# Patient Record
Sex: Female | Born: 1935 | Race: White | Hispanic: No | Marital: Single | Smoking: Current some day smoker
Health system: Southern US, Community
[De-identification: ages and names within clinical notes are randomized; demographics above are authoritative.]

## PROBLEM LIST (undated history)

## (undated) DIAGNOSIS — Z720 Tobacco use: Secondary | ICD-10-CM

## (undated) DIAGNOSIS — Z923 Personal history of irradiation: Secondary | ICD-10-CM

## (undated) DIAGNOSIS — R4701 Aphasia: Secondary | ICD-10-CM

## (undated) DIAGNOSIS — C801 Malignant (primary) neoplasm, unspecified: Secondary | ICD-10-CM

## (undated) HISTORY — PX: BREAST LUMPECTOMY: SHX2

## (undated) HISTORY — PX: FEMUR FRACTURE SURGERY: SHX633

## (undated) HISTORY — PX: BREAST SURGERY: SHX581

---

## 1998-05-17 ENCOUNTER — Emergency Department (HOSPITAL_COMMUNITY): Admission: EM | Admit: 1998-05-17 | Discharge: 1998-05-17 | Payer: Self-pay | Admitting: Emergency Medicine

## 1998-06-28 ENCOUNTER — Other Ambulatory Visit: Admission: RE | Admit: 1998-06-28 | Discharge: 1998-06-28 | Payer: Self-pay | Admitting: Obstetrics and Gynecology

## 1999-01-04 ENCOUNTER — Encounter: Admission: RE | Admit: 1999-01-04 | Discharge: 1999-01-04 | Payer: Self-pay | Admitting: *Deleted

## 1999-01-04 ENCOUNTER — Encounter: Payer: Self-pay | Admitting: *Deleted

## 1999-12-09 ENCOUNTER — Other Ambulatory Visit: Admission: RE | Admit: 1999-12-09 | Discharge: 1999-12-09 | Payer: Self-pay | Admitting: Obstetrics and Gynecology

## 2000-01-14 ENCOUNTER — Emergency Department (HOSPITAL_COMMUNITY): Admission: EM | Admit: 2000-01-14 | Discharge: 2000-01-14 | Payer: Self-pay | Admitting: Emergency Medicine

## 2000-01-14 ENCOUNTER — Encounter: Payer: Self-pay | Admitting: Emergency Medicine

## 2000-12-24 ENCOUNTER — Other Ambulatory Visit: Admission: RE | Admit: 2000-12-24 | Discharge: 2000-12-24 | Payer: Self-pay | Admitting: Obstetrics and Gynecology

## 2001-12-26 ENCOUNTER — Other Ambulatory Visit: Admission: RE | Admit: 2001-12-26 | Discharge: 2001-12-26 | Payer: Self-pay | Admitting: Obstetrics and Gynecology

## 2003-03-16 ENCOUNTER — Encounter (INDEPENDENT_AMBULATORY_CARE_PROVIDER_SITE_OTHER): Payer: Self-pay | Admitting: Specialist

## 2003-03-16 ENCOUNTER — Encounter: Admission: RE | Admit: 2003-03-16 | Discharge: 2003-03-16 | Payer: Self-pay | Admitting: Surgery

## 2003-03-24 ENCOUNTER — Encounter (HOSPITAL_COMMUNITY): Admission: RE | Admit: 2003-03-24 | Discharge: 2003-06-22 | Payer: Self-pay | Admitting: Surgery

## 2003-03-26 ENCOUNTER — Encounter: Admission: RE | Admit: 2003-03-26 | Discharge: 2003-03-26 | Payer: Self-pay | Admitting: Surgery

## 2003-03-26 DIAGNOSIS — Z853 Personal history of malignant neoplasm of breast: Secondary | ICD-10-CM | POA: Insufficient documentation

## 2003-03-30 ENCOUNTER — Encounter (INDEPENDENT_AMBULATORY_CARE_PROVIDER_SITE_OTHER): Payer: Self-pay | Admitting: *Deleted

## 2003-03-30 ENCOUNTER — Ambulatory Visit (HOSPITAL_COMMUNITY): Admission: RE | Admit: 2003-03-30 | Discharge: 2003-03-30 | Payer: Self-pay | Admitting: Surgery

## 2003-03-30 ENCOUNTER — Ambulatory Visit (HOSPITAL_BASED_OUTPATIENT_CLINIC_OR_DEPARTMENT_OTHER): Admission: RE | Admit: 2003-03-30 | Discharge: 2003-03-30 | Payer: Self-pay | Admitting: Surgery

## 2003-03-30 ENCOUNTER — Encounter (INDEPENDENT_AMBULATORY_CARE_PROVIDER_SITE_OTHER): Payer: Self-pay | Admitting: Surgery

## 2003-04-22 ENCOUNTER — Encounter: Payer: Self-pay | Admitting: Cardiology

## 2003-04-22 ENCOUNTER — Ambulatory Visit: Admission: RE | Admit: 2003-04-22 | Discharge: 2003-04-22 | Payer: Self-pay | Admitting: Oncology

## 2003-04-22 ENCOUNTER — Ambulatory Visit: Admission: RE | Admit: 2003-04-22 | Discharge: 2003-05-26 | Payer: Self-pay | Admitting: Radiation Oncology

## 2003-04-23 ENCOUNTER — Ambulatory Visit (HOSPITAL_COMMUNITY): Admission: RE | Admit: 2003-04-23 | Discharge: 2003-04-23 | Payer: Self-pay | Admitting: Oncology

## 2003-05-06 ENCOUNTER — Ambulatory Visit (HOSPITAL_COMMUNITY): Admission: RE | Admit: 2003-05-06 | Discharge: 2003-05-06 | Payer: Self-pay | Admitting: Surgery

## 2003-05-06 ENCOUNTER — Ambulatory Visit (HOSPITAL_BASED_OUTPATIENT_CLINIC_OR_DEPARTMENT_OTHER): Admission: RE | Admit: 2003-05-06 | Discharge: 2003-05-06 | Payer: Self-pay | Admitting: Surgery

## 2003-05-06 ENCOUNTER — Encounter (INDEPENDENT_AMBULATORY_CARE_PROVIDER_SITE_OTHER): Payer: Self-pay | Admitting: Specialist

## 2003-05-25 ENCOUNTER — Ambulatory Visit (HOSPITAL_COMMUNITY): Admission: RE | Admit: 2003-05-25 | Discharge: 2003-05-25 | Payer: Self-pay | Admitting: Oncology

## 2003-06-05 ENCOUNTER — Ambulatory Visit (HOSPITAL_COMMUNITY): Admission: RE | Admit: 2003-06-05 | Discharge: 2003-06-05 | Payer: Self-pay | Admitting: Oncology

## 2003-06-12 ENCOUNTER — Ambulatory Visit (HOSPITAL_BASED_OUTPATIENT_CLINIC_OR_DEPARTMENT_OTHER): Admission: RE | Admit: 2003-06-12 | Discharge: 2003-06-12 | Payer: Self-pay | Admitting: Surgery

## 2003-07-28 ENCOUNTER — Emergency Department (HOSPITAL_COMMUNITY): Admission: EM | Admit: 2003-07-28 | Discharge: 2003-07-29 | Payer: Self-pay | Admitting: Emergency Medicine

## 2003-08-05 ENCOUNTER — Ambulatory Visit: Admission: RE | Admit: 2003-08-05 | Discharge: 2003-08-05 | Payer: Self-pay | Admitting: Geriatric Medicine

## 2003-09-03 ENCOUNTER — Ambulatory Visit (HOSPITAL_COMMUNITY): Admission: RE | Admit: 2003-09-03 | Discharge: 2003-09-03 | Payer: Self-pay | Admitting: Oncology

## 2003-11-10 ENCOUNTER — Ambulatory Visit: Admission: RE | Admit: 2003-11-10 | Discharge: 2004-01-21 | Payer: Self-pay | Admitting: Radiation Oncology

## 2003-11-17 ENCOUNTER — Encounter: Admission: RE | Admit: 2003-11-17 | Discharge: 2003-11-17 | Payer: Self-pay | Admitting: Radiation Oncology

## 2003-12-18 ENCOUNTER — Ambulatory Visit: Payer: Self-pay | Admitting: Oncology

## 2004-02-18 ENCOUNTER — Ambulatory Visit: Admission: RE | Admit: 2004-02-18 | Discharge: 2004-02-18 | Payer: Self-pay | Admitting: Radiation Oncology

## 2004-03-03 ENCOUNTER — Ambulatory Visit: Payer: Self-pay | Admitting: Oncology

## 2004-03-28 ENCOUNTER — Encounter: Admission: RE | Admit: 2004-03-28 | Discharge: 2004-03-28 | Payer: Self-pay | Admitting: Oncology

## 2004-05-04 ENCOUNTER — Ambulatory Visit (HOSPITAL_BASED_OUTPATIENT_CLINIC_OR_DEPARTMENT_OTHER): Admission: RE | Admit: 2004-05-04 | Discharge: 2004-05-04 | Payer: Self-pay | Admitting: Surgery

## 2004-05-24 ENCOUNTER — Ambulatory Visit: Payer: Self-pay | Admitting: Oncology

## 2004-07-13 ENCOUNTER — Ambulatory Visit: Payer: Self-pay | Admitting: Oncology

## 2004-07-31 IMAGING — CT CT HEAD WO/W CM
2 of 3 series · 16 of 30 positions shown, 18 images · IV contrast (omnipaque)
Comparison: none

CLINICAL DATA: Breast cancer ? new diagnosis.  Status post right lumpectomy.
TECHNIQUE: This study was done utilizing 150 cc of Omnipaque 300.  The brain was scanned before and after contrast.  Other areas scanned only after contrast.
 CT HEAD WITHOUT AND WITH CONTRAST 
 Ventricular size and CSF spaces normal for age.  No acute or focal abnormality.  Specifically no evidence for metastases.  Only normal vascular structures appear to enhance.  Calvarium intact with no definite lesions.  No fluid in the sinuses visualized. 
 IMPRESSION
 Normal.
 CT CHEST WITH CONTRAST 
 No pulmonary metastases are evident.  There are some scar-like changes at the right base.  No mediastinal adenopathy.  No pleural or pericardial fluid.  In the right axilla, there is a 2.5 cm fluid collection with internal content measuring at or near water.  This is likely a postoperative seroma.  There are some small nodes in the left axilla that are under 1 cm.  There are no right axillary nodes of significance.  
 1.  There is a fluid collection in the right axilla which is likely to be a postoperative seroma.
 2.  No metastatic disease. 
 CT ABDOMEN WITH CONTRAST 
 No liver or splenic metastases.  The right lobe of the liver extends down below the iliac crest.  Probable Gireesh?Gustave lobe configuration.  Doubt pathological.  Prior cholecystectomy.  Bile ducts normal.  
 Pancreas, adrenals, and kidneys normal.  No adenopathy or ascites. 
 1.  No evidence for metastatic disease. 
 2.  Probable Gireesh?Gustave lobe configuration to the right lobe of the liver. 
 CT PELVIS WITH CONTRAST 
 No focal masses, adenopathy, or fluid collections.  There are several calcifications within the uterus suggesting fibroid disease.  One of these is a pedunculated lesion emanating off the lower uterine segment that is positioned to the left of the rectum. 
 Unremarkable except for probable uterine fibroid disease.

[Series 3: — · axial · 0.43mm/px · z∈[-110,-0]mm · 8 of 30 slices shown, 10 images (1 of 2)]
[im 4/30  brain]
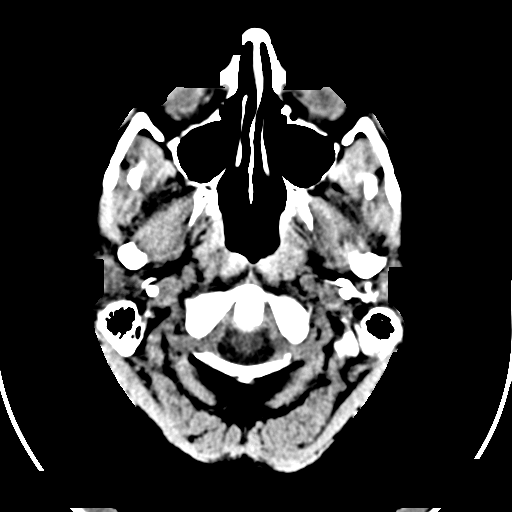
[im 4/30  bone]
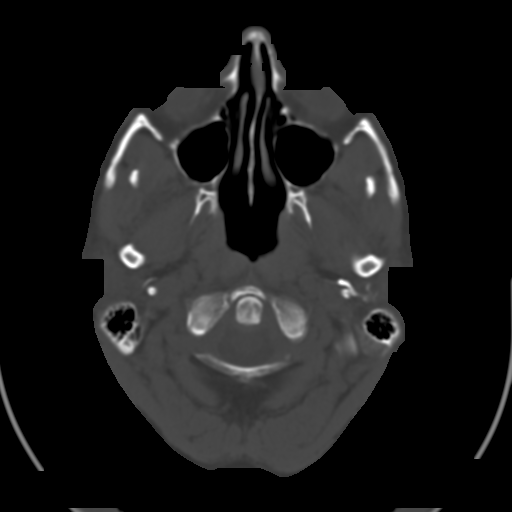
[im 7/30  brain]
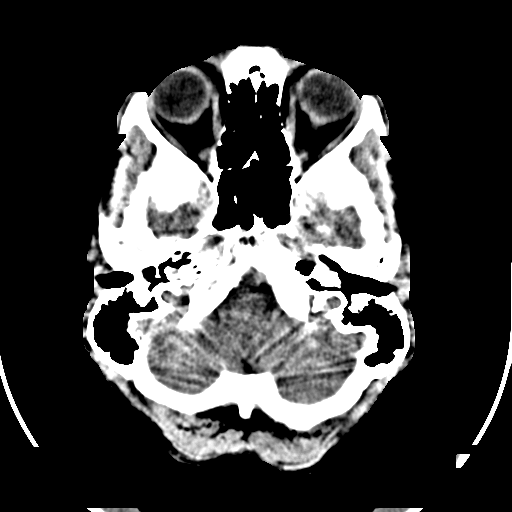
[im 10/30  brain]
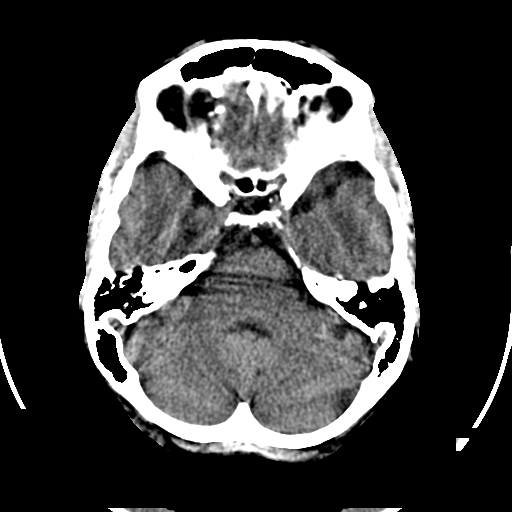
[im 13/30  brain]
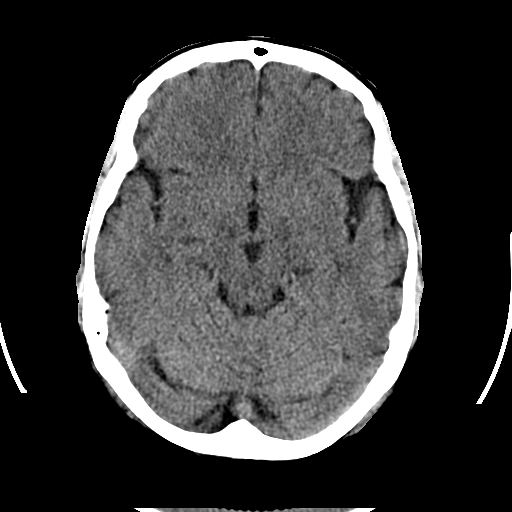
[im 17/30  brain]
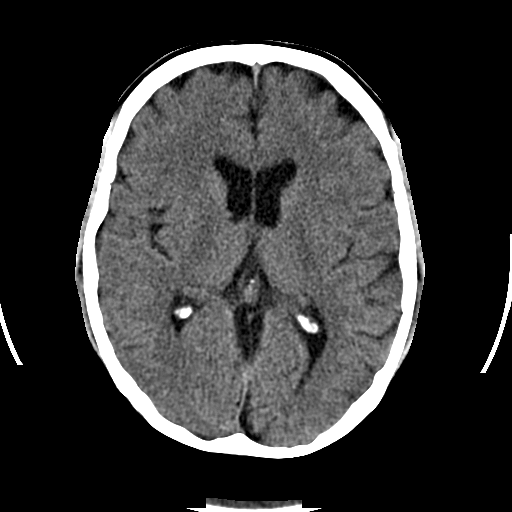
[im 17/30  bone]
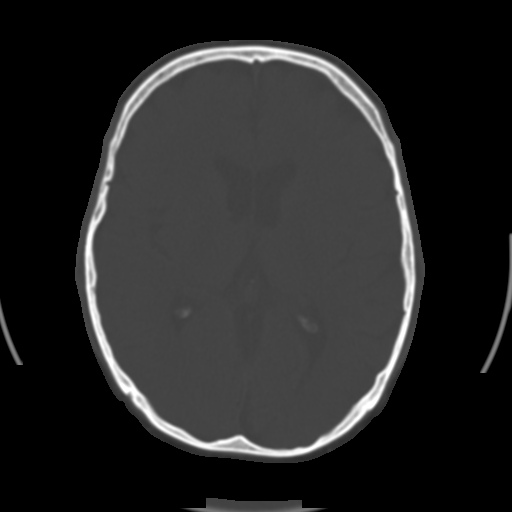
[im 20/30  brain]
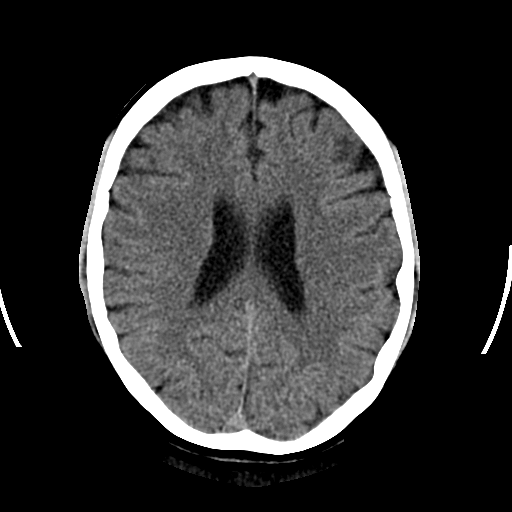
[im 23/30  brain]
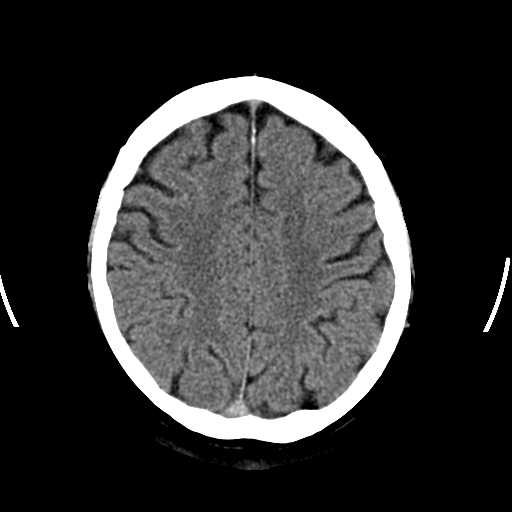
[im 26/30  brain]
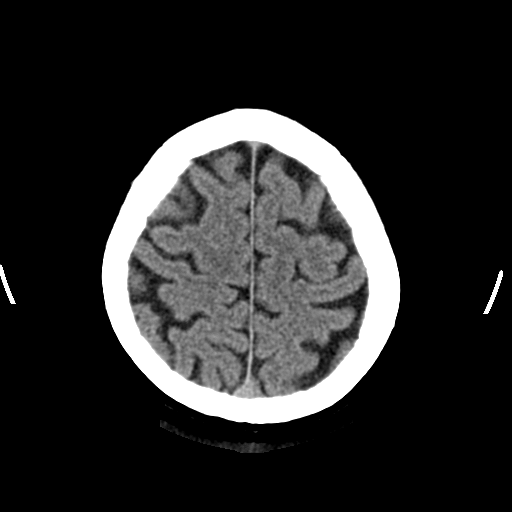

[Series 4: — · axial · 0.43mm/px · z∈[-110,-0]mm · 8 of 30 slices shown (2 of 2)]
[im 4/30  brain]
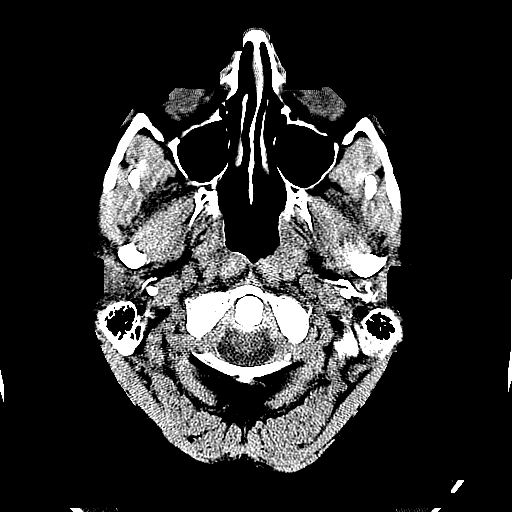
[im 7/30  brain]
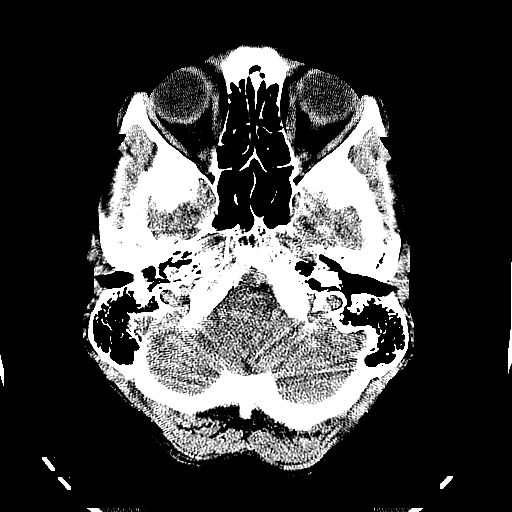
[im 10/30  brain]
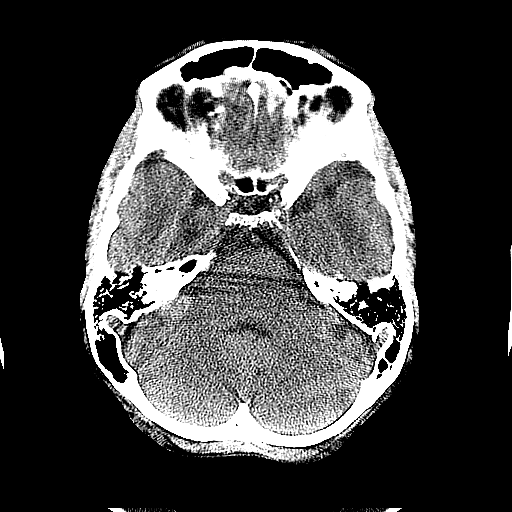
[im 13/30  brain]
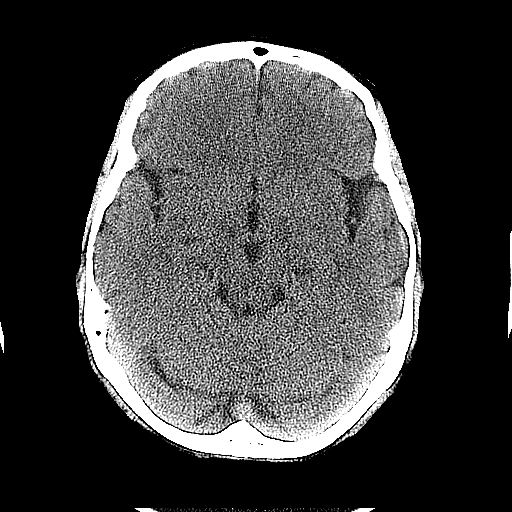
[im 17/30  brain]
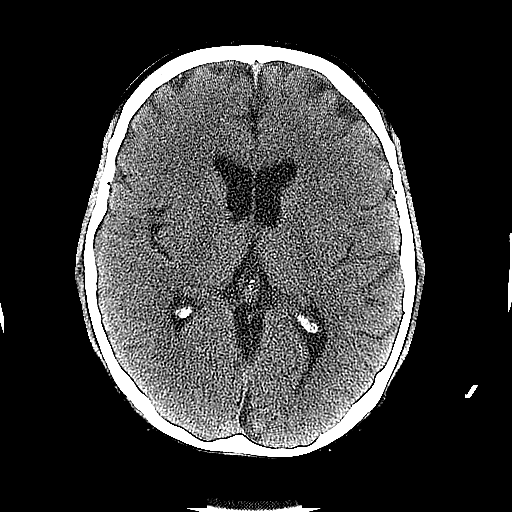
[im 20/30  brain]
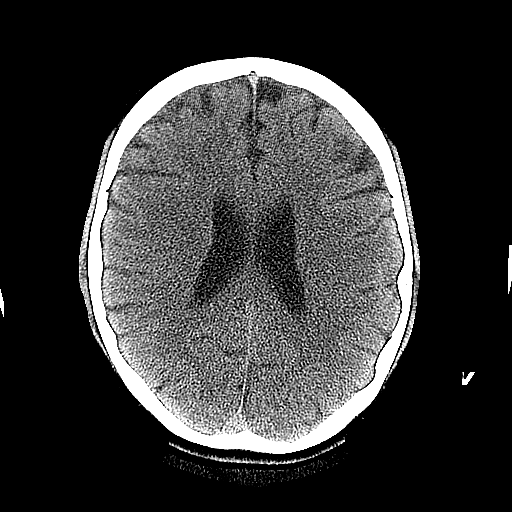
[im 23/30  brain]
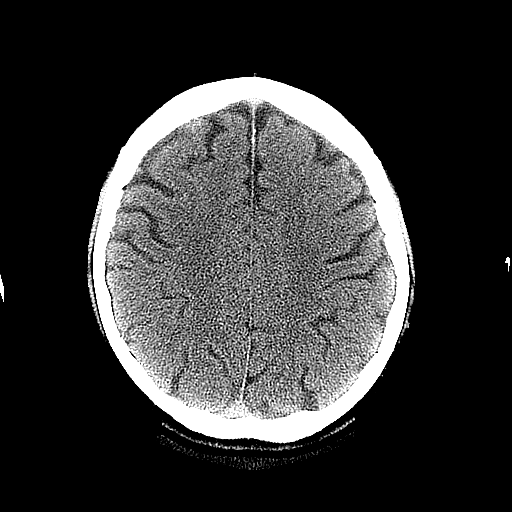
[im 26/30  brain]
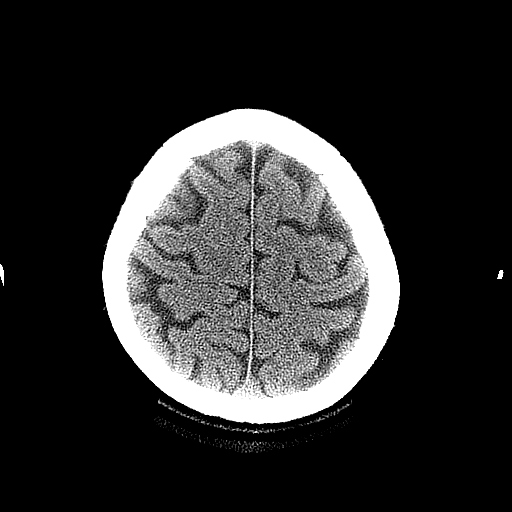

[16 of 30 positions shown; findings below may reference images not displayed]

## 2004-07-31 IMAGING — CR DG THORACIC SPINE 2V
4 series · 4 of 4 positions shown · non-contrast
Comparison: none

CLINICAL DATA: breast cancer
 TOTAL BODY BONE SCAN
 [DATE] mCi of McTTm MDP was administered intravenously with delayed images of the entire body obtained.  
 Minimal increased uptake midthoracic spine.  On the accompanying plain films this corresponds to mild degenerative changes without bony destructive lesion.  No other areas of abnormal radiotracer uptake to suggest bony metastatic disease.  Both kidneys are visualized.  Increased uptake around the right knee and left foot suggestive of degenerative changes. 
 IMPRESSION
 No evidence of abnormal radiotracer uptake to suggest bony metastatic disease as discussed above.  
 AP AND LATERAL CHEST
 Degenerative changes thoracic spine without bony destructive lesion.

[view not recorded (1 of 4)]
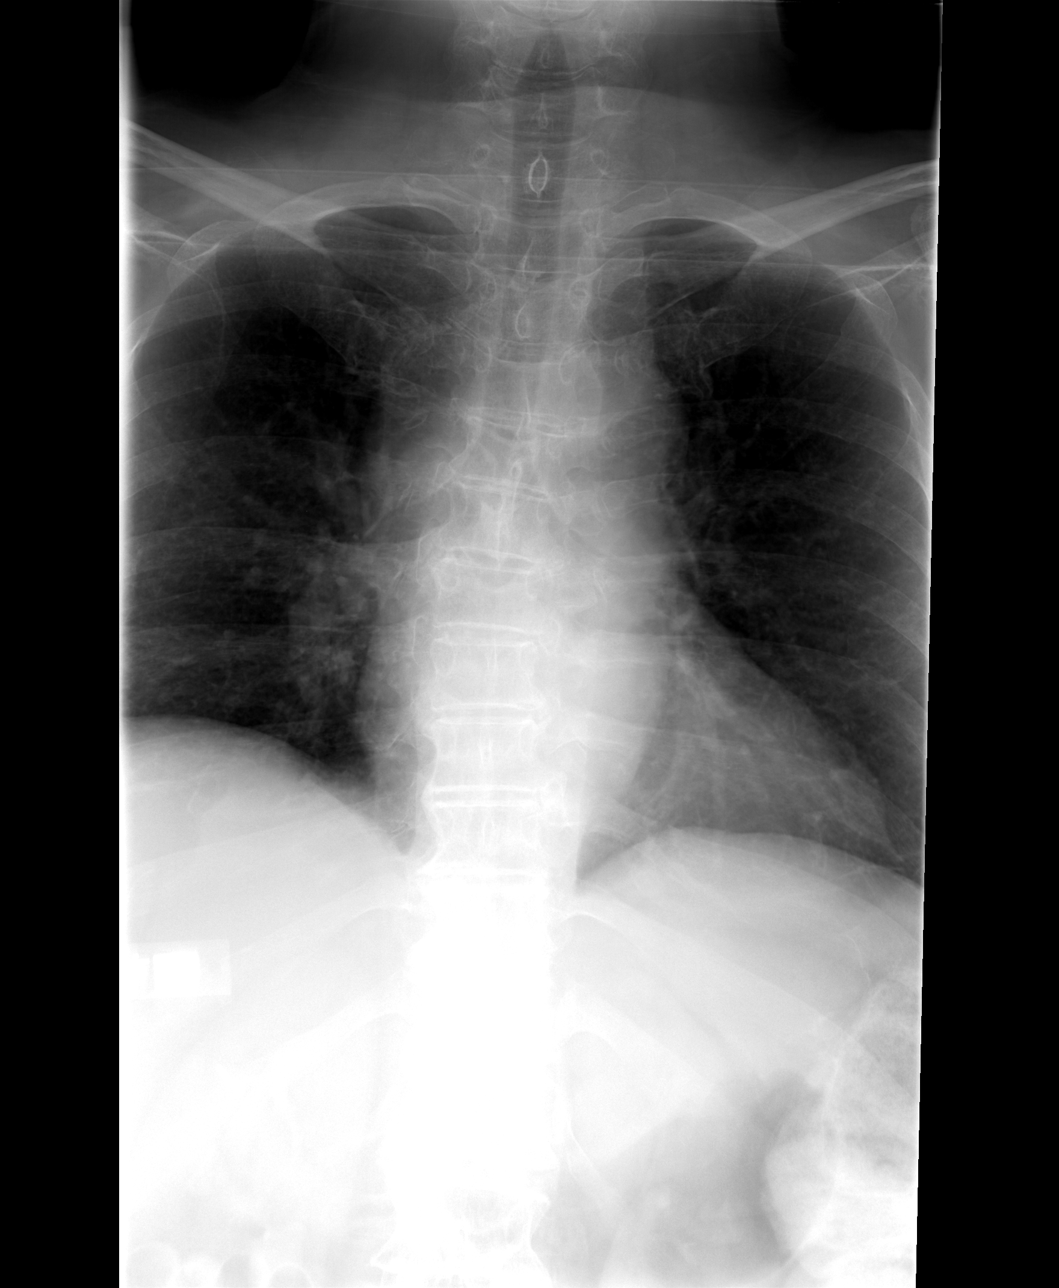

[view not recorded (2 of 4)]
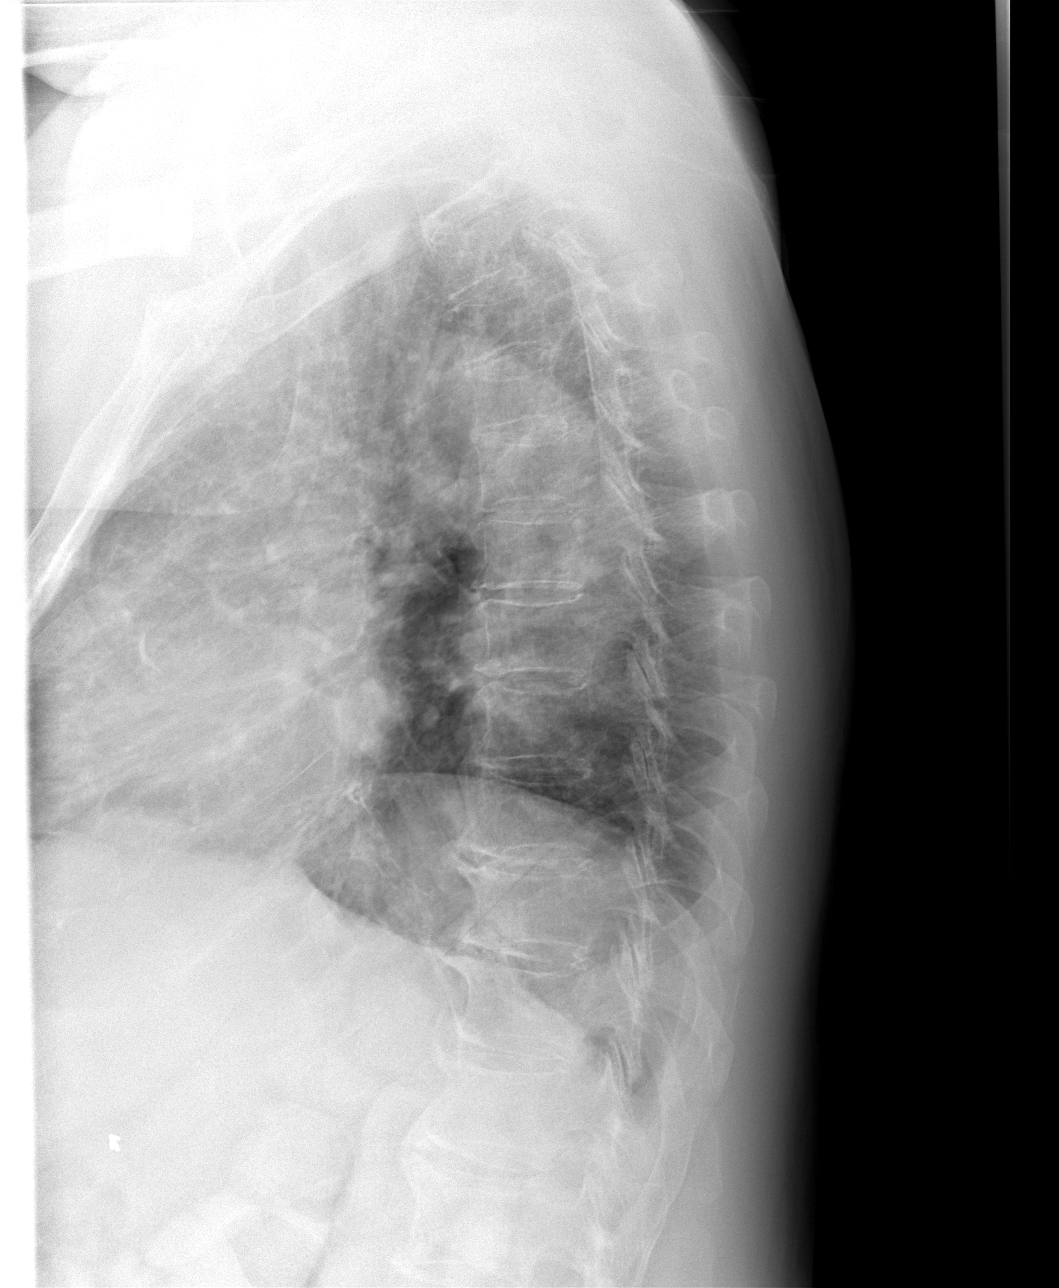

[view not recorded (3 of 4)]
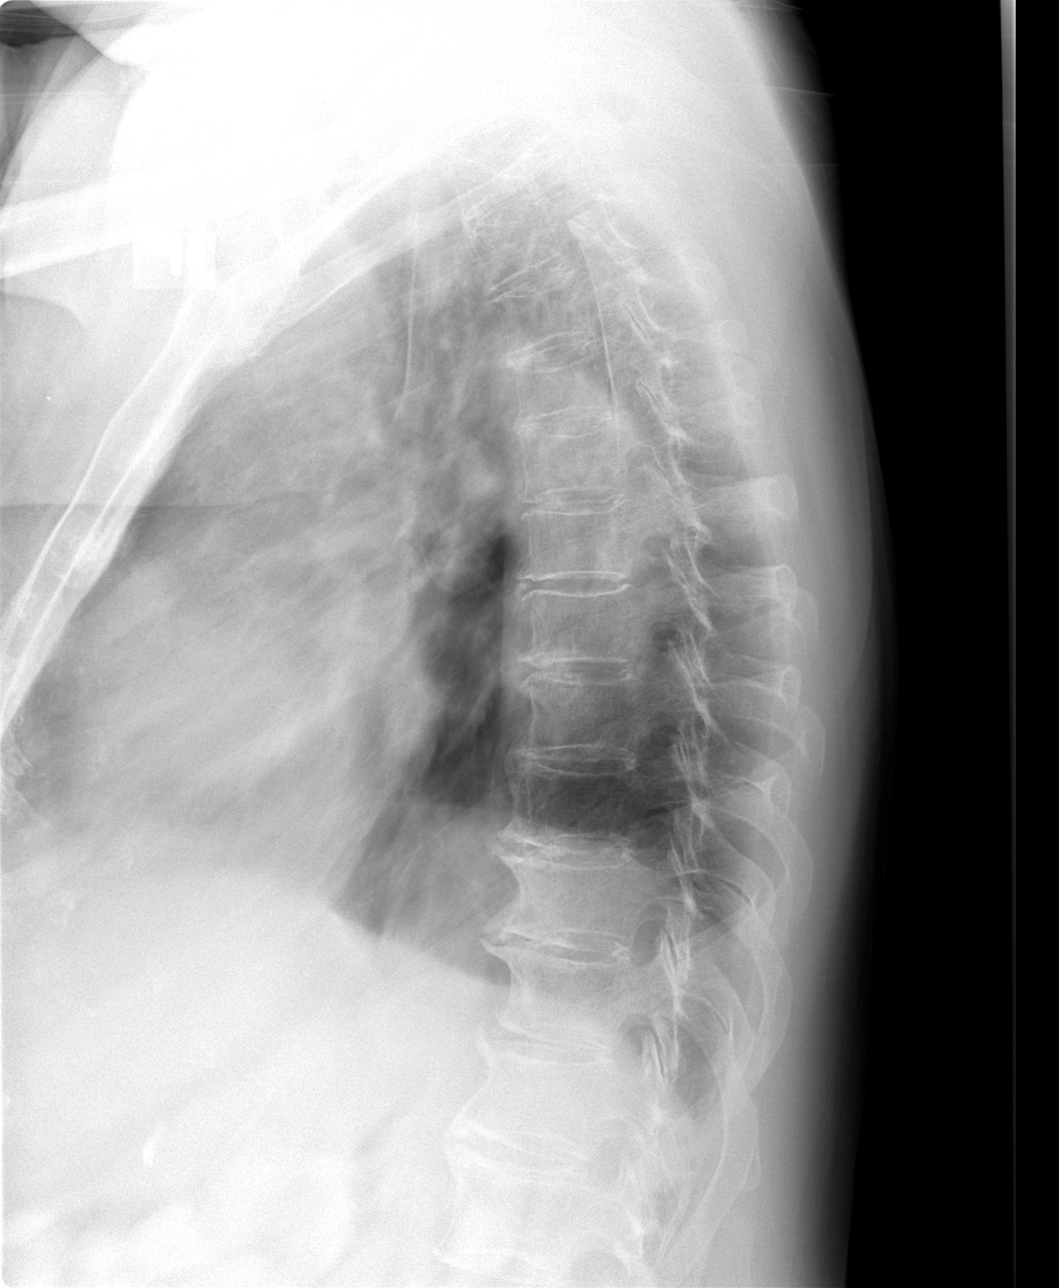

[view not recorded (4 of 4)]
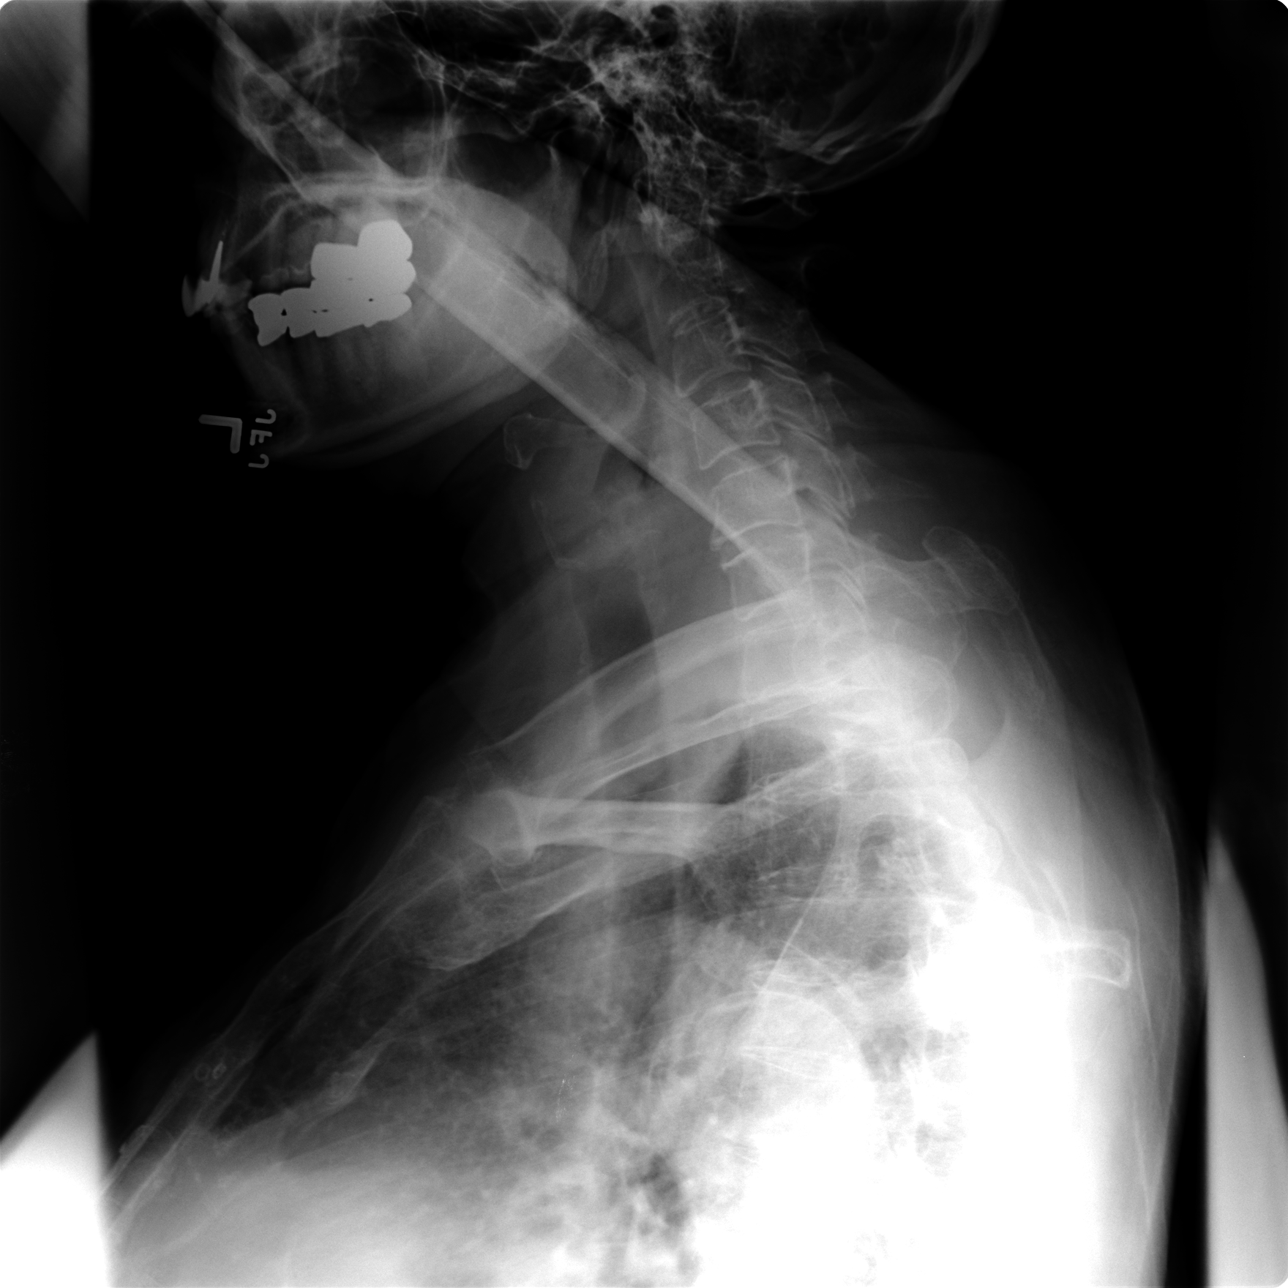

[4 of 4 positions shown; findings below may reference images not displayed]

## 2004-10-07 ENCOUNTER — Ambulatory Visit: Payer: Self-pay | Admitting: Oncology

## 2004-10-11 ENCOUNTER — Ambulatory Visit (HOSPITAL_COMMUNITY): Admission: RE | Admit: 2004-10-11 | Discharge: 2004-10-11 | Payer: Self-pay | Admitting: Oncology

## 2005-01-16 ENCOUNTER — Ambulatory Visit: Payer: Self-pay | Admitting: Oncology

## 2005-03-27 ENCOUNTER — Ambulatory Visit: Payer: Self-pay | Admitting: Oncology

## 2005-07-14 ENCOUNTER — Encounter: Admission: RE | Admit: 2005-07-14 | Discharge: 2005-07-14 | Payer: Self-pay | Admitting: Oncology

## 2005-07-17 ENCOUNTER — Ambulatory Visit: Payer: Self-pay | Admitting: Oncology

## 2005-07-17 LAB — COMPREHENSIVE METABOLIC PANEL
AST: 40 U/L — ABNORMAL HIGH (ref 0–37)
Albumin: 4 g/dL (ref 3.5–5.2)
Alkaline Phosphatase: 88 U/L (ref 39–117)
BUN: 16 mg/dL (ref 6–23)
Glucose, Bld: 306 mg/dL — ABNORMAL HIGH (ref 70–99)
Potassium: 3.9 mEq/L (ref 3.5–5.3)
Total Bilirubin: 0.8 mg/dL (ref 0.3–1.2)

## 2005-07-17 LAB — CBC WITH DIFFERENTIAL/PLATELET
Basophils Absolute: 0 10*3/uL (ref 0.0–0.1)
EOS%: 4 % (ref 0.0–7.0)
HGB: 14.2 g/dL (ref 11.6–15.9)
LYMPH%: 21.6 % (ref 14.0–48.0)
MCH: 32.3 pg (ref 26.0–34.0)
MCV: 93 fL (ref 81.0–101.0)
MONO%: 9.2 % (ref 0.0–13.0)
Platelets: 182 10*3/uL (ref 145–400)
RBC: 4.39 10*6/uL (ref 3.70–5.32)
RDW: 13.4 % (ref 11.3–14.5)

## 2005-07-21 ENCOUNTER — Ambulatory Visit (HOSPITAL_COMMUNITY): Admission: RE | Admit: 2005-07-21 | Discharge: 2005-07-21 | Payer: Self-pay | Admitting: Oncology

## 2006-01-09 ENCOUNTER — Other Ambulatory Visit: Admission: RE | Admit: 2006-01-09 | Discharge: 2006-01-09 | Payer: Self-pay | Admitting: Obstetrics and Gynecology

## 2006-01-11 ENCOUNTER — Ambulatory Visit: Payer: Self-pay | Admitting: Oncology

## 2006-01-15 ENCOUNTER — Encounter: Admission: RE | Admit: 2006-01-15 | Discharge: 2006-01-15 | Payer: Self-pay | Admitting: Obstetrics and Gynecology

## 2006-01-15 LAB — CBC WITH DIFFERENTIAL/PLATELET
BASO%: 0.5 % (ref 0.0–2.0)
Basophils Absolute: 0 10*3/uL (ref 0.0–0.1)
Eosinophils Absolute: 0.2 10*3/uL (ref 0.0–0.5)
HCT: 39.2 % (ref 34.8–46.6)
HGB: 13.6 g/dL (ref 11.6–15.9)
Platelets: 200 10*3/uL (ref 145–400)
RBC: 4.18 10*6/uL (ref 3.70–5.32)
WBC: 6 10*3/uL (ref 3.9–10.0)
lymph#: 1.3 10*3/uL (ref 0.9–3.3)

## 2006-01-15 LAB — COMPREHENSIVE METABOLIC PANEL
ALT: 47 U/L — ABNORMAL HIGH (ref 0–35)
AST: 35 U/L (ref 0–37)
Albumin: 4.7 g/dL (ref 3.5–5.2)
Alkaline Phosphatase: 83 U/L (ref 39–117)
Calcium: 9.8 mg/dL (ref 8.4–10.5)
Chloride: 105 mEq/L (ref 96–112)
Potassium: 4 mEq/L (ref 3.5–5.3)

## 2006-03-16 ENCOUNTER — Encounter: Admission: RE | Admit: 2006-03-16 | Discharge: 2006-03-16 | Payer: Self-pay | Admitting: Obstetrics and Gynecology

## 2006-07-06 ENCOUNTER — Ambulatory Visit: Payer: Self-pay | Admitting: Oncology

## 2006-07-10 LAB — CBC WITH DIFFERENTIAL/PLATELET
Basophils Absolute: 0 10*3/uL (ref 0.0–0.1)
Eosinophils Absolute: 0.2 10*3/uL (ref 0.0–0.5)
HCT: 38.9 % (ref 34.8–46.6)
HGB: 13.9 g/dL (ref 11.6–15.9)
LYMPH%: 24.6 % (ref 14.0–48.0)
MCV: 91.5 fL (ref 81.0–101.0)
MONO#: 0.6 10*3/uL (ref 0.1–0.9)
MONO%: 11.7 % (ref 0.0–13.0)
NEUT#: 3.1 10*3/uL (ref 1.5–6.5)
NEUT%: 59.8 % (ref 39.6–76.8)
Platelets: 167 10*3/uL (ref 145–400)
WBC: 5.2 10*3/uL (ref 3.9–10.0)

## 2006-07-10 LAB — COMPREHENSIVE METABOLIC PANEL
AST: 33 U/L (ref 0–37)
Alkaline Phosphatase: 77 U/L (ref 39–117)
BUN: 19 mg/dL (ref 6–23)
Creatinine, Ser: 0.56 mg/dL (ref 0.40–1.20)

## 2006-07-19 ENCOUNTER — Ambulatory Visit (HOSPITAL_COMMUNITY): Admission: RE | Admit: 2006-07-19 | Discharge: 2006-07-19 | Payer: Self-pay | Admitting: Oncology

## 2006-12-30 ENCOUNTER — Ambulatory Visit: Payer: Self-pay | Admitting: Oncology

## 2007-01-02 LAB — COMPREHENSIVE METABOLIC PANEL
Albumin: 4.5 g/dL (ref 3.5–5.2)
Alkaline Phosphatase: 81 U/L (ref 39–117)
BUN: 15 mg/dL (ref 6–23)
Glucose, Bld: 281 mg/dL — ABNORMAL HIGH (ref 70–99)
Potassium: 4.1 mEq/L (ref 3.5–5.3)
Total Bilirubin: 0.4 mg/dL (ref 0.3–1.2)

## 2007-01-02 LAB — CBC WITH DIFFERENTIAL/PLATELET
Basophils Absolute: 0 10*3/uL (ref 0.0–0.1)
Eosinophils Absolute: 0.2 10*3/uL (ref 0.0–0.5)
HCT: 39.8 % (ref 34.8–46.6)
HGB: 13.9 g/dL (ref 11.6–15.9)
LYMPH%: 15.5 % (ref 14.0–48.0)
MCV: 92.3 fL (ref 81.0–101.0)
MONO%: 12.5 % (ref 0.0–13.0)
NEUT#: 4.1 10*3/uL (ref 1.5–6.5)
Platelets: 177 10*3/uL (ref 145–400)

## 2007-03-25 ENCOUNTER — Encounter: Admission: RE | Admit: 2007-03-25 | Discharge: 2007-03-25 | Payer: Self-pay | Admitting: Geriatric Medicine

## 2007-12-27 ENCOUNTER — Ambulatory Visit: Payer: Self-pay | Admitting: Oncology

## 2007-12-31 LAB — CBC WITH DIFFERENTIAL/PLATELET
BASO%: 0.4 % (ref 0.0–2.0)
HGB: 13.9 g/dL (ref 11.6–15.9)
MCHC: 34.8 g/dL (ref 32.0–36.0)
NEUT%: 68.7 % (ref 39.6–76.8)
RBC: 4.28 10*6/uL (ref 3.70–5.32)

## 2008-01-01 LAB — COMPREHENSIVE METABOLIC PANEL
ALT: 20 U/L (ref 0–35)
AST: 18 U/L (ref 0–37)
Alkaline Phosphatase: 62 U/L (ref 39–117)
Calcium: 9.8 mg/dL (ref 8.4–10.5)
Chloride: 104 mEq/L (ref 96–112)
Glucose, Bld: 264 mg/dL — ABNORMAL HIGH (ref 70–99)
Potassium: 4.5 mEq/L (ref 3.5–5.3)
Total Protein: 6.8 g/dL (ref 6.0–8.3)

## 2008-01-01 LAB — CANCER ANTIGEN 27.29: CA 27.29: 20 U/mL (ref 0–39)

## 2008-03-25 ENCOUNTER — Encounter: Admission: RE | Admit: 2008-03-25 | Discharge: 2008-03-25 | Payer: Self-pay | Admitting: Oncology

## 2008-09-21 ENCOUNTER — Encounter: Admission: RE | Admit: 2008-09-21 | Discharge: 2008-09-21 | Payer: Self-pay | Admitting: Oncology

## 2008-12-29 ENCOUNTER — Ambulatory Visit: Payer: Self-pay | Admitting: Oncology

## 2009-01-04 LAB — CBC WITH DIFFERENTIAL/PLATELET
BASO%: 0.5 % (ref 0.0–2.0)
Basophils Absolute: 0 10*3/uL (ref 0.0–0.1)
LYMPH%: 23 % (ref 14.0–49.7)
MCH: 32.2 pg (ref 25.1–34.0)
MCHC: 34 g/dL (ref 31.5–36.0)
MCV: 94.7 fL (ref 79.5–101.0)
MONO%: 9 % (ref 0.0–14.0)
NEUT#: 3.8 10*3/uL (ref 1.5–6.5)
RDW: 13.2 % (ref 11.2–14.5)
WBC: 5.8 10*3/uL (ref 3.9–10.3)

## 2009-01-04 LAB — COMPREHENSIVE METABOLIC PANEL
BUN: 12 mg/dL (ref 6–23)
CO2: 30 mEq/L (ref 19–32)
Calcium: 9.7 mg/dL (ref 8.4–10.5)
Creatinine, Ser: 0.52 mg/dL (ref 0.40–1.20)
Glucose, Bld: 150 mg/dL — ABNORMAL HIGH (ref 70–99)
Potassium: 3.7 mEq/L (ref 3.5–5.3)
Sodium: 142 mEq/L (ref 135–145)
Total Protein: 7.1 g/dL (ref 6.0–8.3)

## 2009-01-05 LAB — CANCER ANTIGEN 27.29: CA 27.29: 14 U/mL (ref 0–39)

## 2009-02-09 ENCOUNTER — Encounter: Admission: RE | Admit: 2009-02-09 | Discharge: 2009-02-09 | Payer: Self-pay | Admitting: Geriatric Medicine

## 2009-03-22 ENCOUNTER — Ambulatory Visit: Payer: Self-pay | Admitting: Oncology

## 2009-03-23 LAB — BASIC METABOLIC PANEL
CO2: 25 mEq/L (ref 19–32)
Calcium: 10.1 mg/dL (ref 8.4–10.5)
Chloride: 103 mEq/L (ref 96–112)
Creatinine, Ser: 0.61 mg/dL (ref 0.40–1.20)
Potassium: 4 mEq/L (ref 3.5–5.3)
Sodium: 140 mEq/L (ref 135–145)

## 2009-03-26 ENCOUNTER — Encounter: Admission: RE | Admit: 2009-03-26 | Discharge: 2009-03-26 | Payer: Self-pay | Admitting: Oncology

## 2009-03-31 ENCOUNTER — Ambulatory Visit (HOSPITAL_COMMUNITY): Admission: RE | Admit: 2009-03-31 | Discharge: 2009-03-31 | Payer: Self-pay | Admitting: Oncology

## 2009-09-14 ENCOUNTER — Encounter: Admission: RE | Admit: 2009-09-14 | Discharge: 2009-09-14 | Payer: Self-pay | Admitting: Family Medicine

## 2009-09-14 ENCOUNTER — Encounter: Payer: Self-pay | Admitting: Family Medicine

## 2009-09-14 ENCOUNTER — Ambulatory Visit: Payer: Self-pay | Admitting: Sports Medicine

## 2009-09-14 DIAGNOSIS — M79609 Pain in unspecified limb: Secondary | ICD-10-CM | POA: Insufficient documentation

## 2009-09-15 DIAGNOSIS — E119 Type 2 diabetes mellitus without complications: Secondary | ICD-10-CM | POA: Insufficient documentation

## 2009-09-15 DIAGNOSIS — E039 Hypothyroidism, unspecified: Secondary | ICD-10-CM

## 2009-09-15 DIAGNOSIS — M810 Age-related osteoporosis without current pathological fracture: Secondary | ICD-10-CM

## 2009-09-15 DIAGNOSIS — I10 Essential (primary) hypertension: Secondary | ICD-10-CM

## 2009-12-27 ENCOUNTER — Ambulatory Visit: Payer: Self-pay | Admitting: Oncology

## 2009-12-29 LAB — CBC WITH DIFFERENTIAL/PLATELET
HCT: 37.5 % (ref 34.8–46.6)
MCHC: 34.8 g/dL (ref 31.5–36.0)
MCV: 92.3 fL (ref 79.5–101.0)
MONO#: 0.6 10*3/uL (ref 0.1–0.9)
NEUT%: 76.1 % (ref 38.4–76.8)
RBC: 4.06 10*6/uL (ref 3.70–5.45)
RDW: 13.2 % (ref 11.2–14.5)
lymph#: 1.4 10*3/uL (ref 0.9–3.3)

## 2009-12-29 LAB — COMPREHENSIVE METABOLIC PANEL
Albumin: 4.4 g/dL (ref 3.5–5.2)
Calcium: 10 mg/dL (ref 8.4–10.5)
Total Protein: 6.4 g/dL (ref 6.0–8.3)

## 2010-01-06 ENCOUNTER — Ambulatory Visit (HOSPITAL_COMMUNITY)
Admission: RE | Admit: 2010-01-06 | Discharge: 2010-01-06 | Payer: Self-pay | Source: Home / Self Care | Admitting: Oncology

## 2010-02-26 ENCOUNTER — Encounter: Payer: Self-pay | Admitting: Surgery

## 2010-02-27 ENCOUNTER — Encounter: Payer: Self-pay | Admitting: Oncology

## 2010-03-01 ENCOUNTER — Other Ambulatory Visit: Payer: Self-pay | Admitting: Oncology

## 2010-03-01 DIAGNOSIS — Z9889 Other specified postprocedural states: Secondary | ICD-10-CM

## 2010-03-08 NOTE — Assessment & Plan Note (Signed)
Summary: L HEEL PAIN X 4 DAYS   Vital Signs:  Patient profile:   76 year old female Height:      67 inches Weight:      140 pounds BMI:     22.01 BP sitting:   138 / 74  Vitals Entered By: Lillia Pauls CMA (September 14, 2009 3:03 PM)  History of Present Illness: 76yo female to office w/ c/o L heel pain x 4 days. Pt slipped off a 2-step step-stool 4-days ago, landing directly on left heel.  Had immediate pain & heard a "pop." Continues to have pain at the heel & small amount on medial aspect of heel - although it is improving. Denies swelling or bruising.  Denies numbness/tingling.  Denies weakness. Able to ambulate on foot with a limp, therefore using a friends walker to ease pressure on the foot. Has cushioned the heel of shoes at home which has helped. No previous foot injury. Not taking anything for pain.   Current Meds: Metformin Verapamil Synthroid Actos Digoxin Glyburide Vytorin Calcitonin [facility] OsCal ASA 81mg   Allergies (verified): No Known Drug Allergies  Past History:  Past Medical History: Diabetes mellitus, type II Hypertension Hypothyroidism Osteoporosis  Social History: retired professor at [employer], non-smoker  Review of Systems      See HPI  Physical Exam  General:  alert, well-developed, well-nourished, and well-hydrated.   Msk:  FEET:  L foot - No swelling, bruising, or deformity. Normal ROM of foot Mildly TTP over medial aspect of calcaneus inferior to insertion of plantar fascia & plantar surface of foot at tip of calcaneus.  No TTP over talus, tarsals, metatarsals, phalanges.  R foot- no swelling, deformity.  Normal ROM.  No tenderness.  ANKLES: normal ROM b/l, no ligamentous instability, no tenderness, no swelling.  No tenderness over achilles.  Thompson test produces normal plantar flexion.  Normal ankle strength +5/5 b/l.  GAIT: ambulates with limp favoring left foot Pulses:  +2/4 posterior tib & dorsalis pedis  b/l Extremities:  no edema, (+) varicosities lower ext b/l Neurologic:  sensation intact to light touch    Impression & Recommendations:  Problem # 1:  HEEL PAIN, LEFT (ICD-729.5) - Possible contusion vs fracture - Will check Calcaneal x-ray series to evaluate for fracture - Continue to cushion heel while ambulating.  Heel cups given. - May use tylenol as needed pain - Cont. to use walker to maintain balance as needed - will contact with x-ray results - may call [phone]   Orders: Radiology other (Radiology Other) Heel Cup [phone])  ADDENDUM: Pt completed x-rays following office visit & received call from radiologist.  No definitive bony deformity noted, but there was concern for possible non-displaced medial calcaneal fx.  Films were personally reviewed & also reviewed wih Dr. Darrick Penna.  No signs of definitive fx.  Suspect calcaneal contusion.  Pt should wear heel cups in shoes for added cushion/comfort.  Should continue to ambulate as tolerated.  Should contact office if having increased pain or having difficulty walking.  Should f/u as needed.  Complete Medication List: 1)  Metformin Hcl 500 Mg Tabs (Metformin hcl) 2)  Verapamil Hcl 40 Mg Tabs (Verapamil hcl)

## 2010-03-29 ENCOUNTER — Ambulatory Visit
Admission: RE | Admit: 2010-03-29 | Discharge: 2010-03-29 | Disposition: A | Discharge status: Home / Self Care | Payer: Medicare Other | Source: Ambulatory Visit | Attending: Oncology | Admitting: Oncology

## 2010-03-29 DIAGNOSIS — Z9889 Other specified postprocedural states: Secondary | ICD-10-CM

## 2010-05-30 ENCOUNTER — Encounter: Payer: Self-pay | Admitting: *Deleted

## 2010-06-24 NOTE — Op Note (Signed)
Larson, Kelly                          ACCOUNT NO.:  [id number]   MEDICAL RECORD NO.:  [id number]                   PATIENT TYPE:  AMB   LOCATION:  DSC                                  FACILITY:  [facility]   PHYSICIAN:  Currie Paris, M.D.           DATE OF BIRTH:  [birth year]   DATE OF PROCEDURE:  03/30/2003  DATE OF DISCHARGE:                                 OPERATIVE REPORT   OFFICE MEDICAL RECORD NUMBER:  [id number]   PREOPERATIVE DIAGNOSIS:  Right breast cancer lower outer quadrant.   POSTOPERATIVE DIAGNOSIS:  Right breast cancer lower outer quadrant.   OPERATION:  Right partial mastectomy with blue dye injection and sentinel  node biopsy (three nodes).   SURGEON:  Currie Paris, M.D.   ANESTHESIA:  General (LMA).   CLINICAL HISTORY:  This patient is a 76 year old who felt an abnormality in  the upper outer quadrant of her right breast and on evaluation this turned  out to be negative, but on physical, was noted to have an unrecognized lower  outer quadrant mass that was about a cm in size and ultrasound was  consistent with malignancy and core biopsy confirmed that diagnosis.  A  description of options for the patient, we elected to proceed to wide local  excision with sentinel node biopsy.   DESCRIPTION OF PROCEDURE:  The patient was seen in the holding area and had  no further questions.  She had already marked the right side as the  operative side and it had already been injected with radioactive isotope.  I  confirmed and marked the right side, as well.  The patient was taken to the  operating room and after satisfactory general anesthesia had been obtained,  the right breast was injected with about 4 mL of methylene blue.  The mass,  itself, had been palpated and marked and was confirmed with ultrasound.  It  was almost at the inframammary fold.  The breast and axillary areas were  then prepped and draped as a single sterile field.  The Neoprobe  identified  a relatively hot area compared to background in the axilla and the  transverse incision was made.  The subcutaneous tissues were divided and the  axilla proper entered.  A small lymphatic was found, traced to a  blue lymph  node which had counts of about 80 to 100.  The breast, itself, only had  counts of around 2000.  This was excised and an adjacent node that was firm  and had some counts of about 50 was also excised.  With those two out, I  used the Neoprobe and found another hot area, this time measuring around 150  and it was a little bit more posterior and a little bit higher up and this  was, after a little bit of dissection, identified and excised.  I thought  there was just a touch of blue coming into it.  It had counts of around 400  when it was ex vivo.  At this point, we checked for hemostasis.  I used  clips, ties, and cautery for hemostasis and everything appeared to be dry so  I left a small pack in place.   Attention was turned to the breast mass.  With some retraction of the breast  and nipple area superiorly and medially, I made an elliptical incision over  the mass trying to get enough skin over the mass to be completely around the  mass.  Using cautery, I then took full thickness breast subcutaneous tissue  down to chest wall excision and felt that I was at least 0.5 cm in all  directions around the mass.  Everything was treated with cautery for  hemostasis and once everything appeared to be dry, I injected some Marcaine  to help with postop analgesia.  This incision was closed in layers with 3-0  Vicryl followed by 4-0 Monocryl subcuticular.   Attention was turned back to the axillary incision.  This had remained dry  and then was injected with some Marcaine and closed with 3-0 Vicryl and 4-0  Monocryl.  At this point, we observed the incisions for about 15 minutes  while waiting on pathology.  Fortunately, the nodes turned out to be all  negative and the  margins appeared, likewise, negative.  There had been no  bleeding through the incision and no evidence of hematoma development.  A  final dressing with Dermabond was placed.  The patient tolerated the  procedure well.  There were no operative complications.  All counts were  correct.                                               Currie Paris, M.D.    CJS/MEDQ  D:  03/30/2003  T:  03/30/2003  Job:  [id number]   cc:   Hal T. Stoneking, M.D.  [address]  [place], [place] [address]  Fax: [phone]   Laqueta Linden, M.D.  [address]  [place]  [place] [address]  Fax: [phone]

## 2010-06-24 NOTE — Op Note (Signed)
NAMEELSY, CHIANG                          ACCOUNT NO.:  [id number]   MEDICAL RECORD NO.:  [id number]                   PATIENT TYPE:  AMB   LOCATION:  DSC                                  FACILITY:  [facility]   PHYSICIAN:  Thornton Park. Daphine Deutscher, M.D.             DATE OF BIRTH:  [birth year]   DATE OF PROCEDURE:  06/12/2003  DATE OF DISCHARGE:                                 OPERATIVE REPORT   PREOPERATIVE DIAGNOSIS:  Right sided breast cancer, positive nodes.   POSTOPERATIVE DIAGNOSIS:  Right sided breast cancer, positive nodes, status  post placement of left subclavian Port-A-Cath with right angle Huber needle  left in place for access today.   SURGEON:  Thornton Park. Daphine Deutscher, M.D.   ANESTHESIA:  MAC.   DESCRIPTION OF PROCEDURE:  [name] is a 76 year old lady who was seen  in the holding area and informed consent was obtained regarding placement of  Port-A-Cath and the risks and benefits of the procedure and complications  not limited to pneumothorax, vascular thrombosis, bleeding, etc.  The  patient was taken back to room 6 at [facility] and placed in  Trendelenburg.  The neck and chest were prepped with Betadine and draped  sterilely.  The left subclavian vein was cannulated on the first pass and  the J-wire passed easily.  We had to wait on C-arm as no technician was  initially available for about ten minutes.  In the meantime, I went ahead  and fashioned a pocket and actually tunneled the detached catheter through  the subcutaneous tissue, out by the wire, flushed this.  I then checked the  placement with the C-arm and there was good position.  Over the wire, I  passed the obturator and peel away sheath and then removed the obturator and  wire.  Through the peel away sheath and under C-arm guidance, I passed the  catheter into the superior vena cava canoatrial junction.  This was peeled  away en toto.  The catheter was then aspirated and good blood return  occurred.  It  was cut and connected to a single chamber plastic port and the  radio-opaque marked clear flange locker was passed up over the stem after  the catheter was threaded up on the stem at least halfway up.  This snapped  in place and secured the port placement with the plastic well.  It was  sutured into place with two sutures of 2-0 Prolene to the fascia.  There was  no bleeding noted.  It was then again reaspirated and it aspirated easily.  I then placed it in the pocket and closed the pocket with 4-0 Vicryl  subcutaneously with Benzoin and Steri-Strips.  I then accessed it with a  right angle Huber needle, got blood return, and then flushed it with  concentrated heparin solution.  This was then clipped and capped and  prepared for  subsequent use later  in the day for chemotherapy.  A chest x-ray was ordered  in the recovery room.  She was given a prescription for Vicodin to take for  pain and will be followed back up in the office with Dr. Jamey Ripa in probably  2-3 weeks.                                               Thornton Park Daphine Deutscher, M.D.    MBM/MEDQ  D:  06/12/2003  T:  06/12/2003  Job:  [id number]   cc:   Valentino Hue. Magrinat, M.D.  [address] [facility]  [place]  [place] [address]  Fax: [phone]   Currie Paris, M.D.  [address], [address]  [place]  [place] [address]  Fax: [phone]

## 2010-06-24 NOTE — Op Note (Signed)
[name], [name]              ACCOUNT NO.:  [id number]   MEDICAL RECORD NO.:  [id number]          PATIENT TYPE:  AMB   LOCATION:  DSC                          FACILITY:  [facility]   PHYSICIAN:  Currie Paris, M.D.DATE OF BIRTH:  10-23-1934   DATE OF PROCEDURE:  05/04/2004  DATE OF DISCHARGE:                                 OPERATIVE REPORT   PREOPERATIVE DIAGNOSIS:  Unneeded Port-A-Cath.   POSTOPERATIVE DIAGNOSIS:  Unneeded Port-A-Cath.   OPERATION:  Removal of Port-A-Cath.   SURGEON:  Currie Paris, M.D.   ANESTHESIA:  Local.   CLINICAL HISTORY:  This patient has finished her chemotherapy and wished to  have her Port-A-Cath removed.   DESCRIPTION OF PROCEDURE:  The patient was seen in the minor procedure room  and we confirmed Port-A-Cath removal as the planned procedure.  The area  over the Port-A-Cath was prepped with a little alcohol, anesthetized with 1%  Xylocaine with epinephrine using a total of about 9 mL.  I waited about 10  minutes for the local to have full effect and then prepped the area with  Betadine.  A time-out occurred.   I reopened the old scar.  I immediately identified the two Prolene sutures  holding the Port-A-Cath in place and cut those.  This mobilized the port out  of its pocket, and the reservoir was manipulated into the wound.  I then  backed the tubing part-way out and stopped to place a figure-of-eight 3-0  Vicryl around the tract leading to the subclavian vein.  The Port-A-Cath  tubing was pulled out and that figure-of-eight tied down.  There was no  backbleeding.   The incision was dry and it was then closed with 3-0 Vicryl, followed by 4-0  Monocryl subcuticular and Dermabond.   The patient tolerated the procedure well.  There were no operative  complications.  All counts were correct.      CJS/MEDQ  D:  05/04/2004  T:  05/04/2004  Job:  [id number]

## 2010-06-24 NOTE — Op Note (Signed)
[name], [name]                          ACCOUNT NO.:  [id number]   MEDICAL RECORD NO.:  [id number]                   PATIENT TYPE:  AMB   LOCATION:  DSC                                  FACILITY:  [facility]   PHYSICIAN:  Currie Paris, M.D.           DATE OF BIRTH:  1934/11/23   DATE OF PROCEDURE:  05/06/2003  DATE OF DISCHARGE:                                 OPERATIVE REPORT   PREOPERATIVE DIAGNOSIS:  Stage II cancer, right breast.   POSTOPERATIVE DIAGNOSIS:  Stage II cancer, right breast.   OPERATION:  Right axillary dissection.   SURGEON:  Currie Paris, M.D.   ANESTHESIA:  General.   CLINICAL HISTORY:  This patient is a 76 year old lady with a lobular cancer  of the right breast whose sentinel lymph node biopsy (three nodes) was  negative at the time of surgery but permanents revealed her to have  metastatic disease.  After radiation and medical oncology consultations, it  was elected to proceed to a right axillary dissection.  Initially, we  planned to put a Port-A-Cath in for chemotherapy but some of those plans  have changed because she may just get oral chemotherapy, so we cancelled the  plans for Port-A-Cath and planned only for the axillary dissection.   DESCRIPTION OF PROCEDURE:  The patient was seen in the holding area and had  no further questions.  She was taken to the operating room.  After  satisfactory general anesthesia had been obtained, the right axillary area  was prepped and draped.  I injected some Marcaine around the skin and then  opened the old scar and extended a little bit anteriorly and posteriorly.  I  divided straight down through the fatty tissue to the chest wall and carried  the incision anteriorly to the pectoralis and posteriorly to the latissimus.  I then raised up the axillary contents and there was a lot of very hard  tissue in here which I think was all fat necrosis from her prior surgery.  I  then used the cautery to open  along the clavipectoral fascia on the lateral  border of the pectoralis and began dissecting tissue out of the axilla and  eventually was able to do a little blunt dissection with a Barista  and identify the axillary vein.  The long thoracic nerve had been identified  low down and was traced superiorly to make sure we preserved it.  Once we  had the vein identified, I began stripping the contents out staying below  the vein and working from medial to lateral and superiorly to inferiorly.  Some vessels coming into the axillary contents were clipped.  The  thoracodorsal vessels and nerve were identified and preserved.  Once I had  all this stripped down, I was then able to detach the final attachments to  the latissimus using cautery.  The area was irrigated and hemostasis was  achieved.  Most of the vessels had been clipped with the smaller ones  cauterized.  Once I was clear that we had all the bleeding controlled and I  could palpate and feel no additional adenopathy and was able to pinch the  nerves to make sure they all worked, I went ahead and put a drain in.  We  used a 19 Blake.  I injected about 5 mL of 0.25% Marcaine around the long  thoracic, thoracodorsal, and nerve to the pectoralis.  The incision was  closed with 3-0 Vicryl followed by 4-0 Monocryl.  The drain was secured with  a nylon. I  put an additional 5 mL of Marcaine into the wound cavity and  left it for ten minutes to see if that would also help with postoperative  analgesia.  The patient tolerated the procedure well.  There were no  operative complications.  All counts were correct.                                               Currie Paris, M.D.    CJS/MEDQ  D:  05/06/2003  T:  05/06/2003  Job:  [id number]   cc:   Laqueta Linden, M.D.  [address]  [place]  [place] [address]  Fax: [phone]   Hal T. Stoneking, M.D.  [address]  [place], [place] [address]  Fax: [phone]    [facility]   Valentino Hue. Magrinat, M.D.  [address] [facility]  [place]  [place] [address]  Fax: [phone]

## 2010-11-08 ENCOUNTER — Other Ambulatory Visit: Payer: Self-pay | Admitting: Oncology

## 2010-11-08 ENCOUNTER — Encounter (HOSPITAL_BASED_OUTPATIENT_CLINIC_OR_DEPARTMENT_OTHER): Payer: Medicare Other | Admitting: Oncology

## 2010-11-08 DIAGNOSIS — C50419 Malignant neoplasm of upper-outer quadrant of unspecified female breast: Secondary | ICD-10-CM

## 2010-11-08 DIAGNOSIS — Z170 Estrogen receptor positive status [ER+]: Secondary | ICD-10-CM

## 2010-11-08 LAB — COMPREHENSIVE METABOLIC PANEL
BUN: 13 mg/dL (ref 6–23)
CO2: 29 mEq/L (ref 19–32)
Calcium: 10 mg/dL (ref 8.4–10.5)
Creatinine, Ser: 0.47 mg/dL — ABNORMAL LOW (ref 0.50–1.10)
Glucose, Bld: 141 mg/dL — ABNORMAL HIGH (ref 70–99)
Total Bilirubin: 0.5 mg/dL (ref 0.3–1.2)

## 2010-11-08 LAB — CBC WITH DIFFERENTIAL/PLATELET
BASO%: 0.4 % (ref 0.0–2.0)
Basophils Absolute: 0 10*3/uL (ref 0.0–0.1)
Eosinophils Absolute: 0.1 10*3/uL (ref 0.0–0.5)
HCT: 42 % (ref 34.8–46.6)
HGB: 14.6 g/dL (ref 11.6–15.9)
LYMPH%: 27.1 % (ref 14.0–49.7)
MCHC: 34.8 g/dL (ref 31.5–36.0)
MONO#: 0.5 10*3/uL (ref 0.1–0.9)
NEUT%: 60.8 % (ref 38.4–76.8)
Platelets: 155 10*3/uL (ref 145–400)
WBC: 5.2 10*3/uL (ref 3.9–10.3)

## 2010-11-21 ENCOUNTER — Encounter (HOSPITAL_BASED_OUTPATIENT_CLINIC_OR_DEPARTMENT_OTHER): Payer: Medicare Other | Admitting: Oncology

## 2010-11-21 DIAGNOSIS — Z170 Estrogen receptor positive status [ER+]: Secondary | ICD-10-CM

## 2010-11-21 DIAGNOSIS — C50419 Malignant neoplasm of upper-outer quadrant of unspecified female breast: Secondary | ICD-10-CM

## 2010-11-23 ENCOUNTER — Encounter (INDEPENDENT_AMBULATORY_CARE_PROVIDER_SITE_OTHER): Payer: Self-pay | Admitting: Surgery

## 2011-01-22 ENCOUNTER — Inpatient Hospital Stay (HOSPITAL_COMMUNITY)
Admission: EM | Admit: 2011-01-22 | Discharge: 2011-01-24 | DRG: 536 | Disposition: A | Discharge status: Home Health | Payer: Medicare Other | Attending: Internal Medicine | Admitting: Internal Medicine

## 2011-01-22 ENCOUNTER — Emergency Department (HOSPITAL_COMMUNITY): Payer: Medicare Other

## 2011-01-22 DIAGNOSIS — F17200 Nicotine dependence, unspecified, uncomplicated: Secondary | ICD-10-CM | POA: Diagnosis present

## 2011-01-22 DIAGNOSIS — IMO0002 Reserved for concepts with insufficient information to code with codable children: Secondary | ICD-10-CM | POA: Diagnosis present

## 2011-01-22 DIAGNOSIS — E039 Hypothyroidism, unspecified: Secondary | ICD-10-CM | POA: Diagnosis present

## 2011-01-22 DIAGNOSIS — I10 Essential (primary) hypertension: Secondary | ICD-10-CM | POA: Diagnosis present

## 2011-01-22 DIAGNOSIS — Y92009 Unspecified place in unspecified non-institutional (private) residence as the place of occurrence of the external cause: Secondary | ICD-10-CM

## 2011-01-22 DIAGNOSIS — S32509A Unspecified fracture of unspecified pubis, initial encounter for closed fracture: Principal | ICD-10-CM | POA: Diagnosis present

## 2011-01-22 DIAGNOSIS — E119 Type 2 diabetes mellitus without complications: Secondary | ICD-10-CM | POA: Diagnosis present

## 2011-01-22 DIAGNOSIS — S329XXA Fracture of unspecified parts of lumbosacral spine and pelvis, initial encounter for closed fracture: Secondary | ICD-10-CM

## 2011-01-22 DIAGNOSIS — S3282XA Multiple fractures of pelvis without disruption of pelvic ring, initial encounter for closed fracture: Secondary | ICD-10-CM | POA: Diagnosis present

## 2011-01-22 DIAGNOSIS — E1165 Type 2 diabetes mellitus with hyperglycemia: Secondary | ICD-10-CM | POA: Diagnosis present

## 2011-01-22 DIAGNOSIS — Z66 Do not resuscitate: Secondary | ICD-10-CM | POA: Diagnosis present

## 2011-01-22 DIAGNOSIS — W010XXA Fall on same level from slipping, tripping and stumbling without subsequent striking against object, initial encounter: Secondary | ICD-10-CM | POA: Diagnosis present

## 2011-01-22 DIAGNOSIS — M810 Age-related osteoporosis without current pathological fracture: Secondary | ICD-10-CM | POA: Diagnosis present

## 2011-01-22 HISTORY — DX: Malignant (primary) neoplasm, unspecified: C80.1

## 2011-01-22 LAB — DIFFERENTIAL
Basophils Absolute: 0 10*3/uL (ref 0.0–0.1)
Basophils Relative: 0 % (ref 0–1)
Eosinophils Relative: 0 % (ref 0–5)
Monocytes Absolute: 1.1 10*3/uL — ABNORMAL HIGH (ref 0.1–1.0)
Monocytes Relative: 11 % (ref 3–12)

## 2011-01-22 LAB — CBC
HCT: 37.9 % (ref 36.0–46.0)
Hemoglobin: 12.5 g/dL (ref 12.0–15.0)
MCH: 30.5 pg (ref 26.0–34.0)
MCHC: 33 g/dL (ref 30.0–36.0)
MCV: 92.4 fL (ref 78.0–100.0)
RDW: 13.1 % (ref 11.5–15.5)

## 2011-01-22 LAB — BASIC METABOLIC PANEL
BUN: 16 mg/dL (ref 6–23)
CO2: 27 mEq/L (ref 19–32)
Calcium: 10.1 mg/dL (ref 8.4–10.5)
Chloride: 102 mEq/L (ref 96–112)
Creatinine, Ser: 0.46 mg/dL — ABNORMAL LOW (ref 0.50–1.10)
GFR calc Af Amer: >90 mL/min (ref >90)

## 2011-01-22 LAB — PROTIME-INR: Prothrombin Time: 12.7 seconds (ref 11.6–15.2)

## 2011-01-22 LAB — GLUCOSE, CAPILLARY: Glucose-Capillary: 243 mg/dL — ABNORMAL HIGH (ref 70–99)

## 2011-01-22 MED ORDER — INSULIN ASPART 100 UNIT/ML ~~LOC~~ SOLN
0.0000 [IU] | Freq: Three times a day (TID) | SUBCUTANEOUS | Status: DC
  Administered 2011-01-23: 2 [IU] via SUBCUTANEOUS
  Administered 2011-01-23: 3 [IU] via SUBCUTANEOUS
  Administered 2011-01-23: 5 [IU] via SUBCUTANEOUS
  Administered 2011-01-24: 4 [IU] via SUBCUTANEOUS
  Administered 2011-01-24: 2 [IU] via SUBCUTANEOUS
  Filled 2011-01-22: qty 3

## 2011-01-22 MED ORDER — ONDANSETRON HCL 4 MG PO TABS: 4.0000 mg | ORAL_TABLET | Freq: Four times a day (QID) | ORAL | Status: DC | PRN

## 2011-01-22 MED ORDER — MORPHINE SULFATE 4 MG/ML IJ SOLN
4.0000 mg | Freq: Once | INTRAMUSCULAR | Status: AC
  Administered 2011-01-22: 4 mg via INTRAVENOUS

## 2011-01-22 MED ORDER — PIOGLITAZONE HCL 15 MG PO TABS
15.0000 mg | ORAL_TABLET | Freq: Every day | ORAL | Status: DC
  Administered 2011-01-23 – 2011-01-24 (×2): 15 mg via ORAL
  Filled 2011-01-22 (×2): qty 1

## 2011-01-22 MED ORDER — INSULIN ASPART 100 UNIT/ML ~~LOC~~ SOLN
0.0000 [IU] | Freq: Every day | SUBCUTANEOUS | Status: DC
Start: 2011-01-23 — End: 2011-01-24

## 2011-01-22 MED ORDER — POLYETHYLENE GLYCOL 3350 17 G PO PACK: 17.0000 g | PACK | Freq: Every day | ORAL | Status: DC | PRN

## 2011-01-22 MED ORDER — OXYCODONE HCL 5 MG PO TABS
5.0000 mg | ORAL_TABLET | ORAL | Status: DC | PRN
  Administered 2011-01-23 – 2011-01-24 (×4): 5 mg via ORAL
  Filled 2011-01-22 (×4): qty 1

## 2011-01-22 MED ORDER — CALCIUM CARBONATE 1250 (500 CA) MG PO TABS
500.0000 mg | ORAL_TABLET | Freq: Two times a day (BID) | ORAL | Status: DC
  Administered 2011-01-23 – 2011-01-24 (×3): 500 mg via ORAL
  Filled 2011-01-22 (×4): qty 1

## 2011-01-22 MED ORDER — LEVOTHYROXINE SODIUM 125 MCG PO TABS
125.0000 ug | ORAL_TABLET | Freq: Every day | ORAL | Status: DC
  Administered 2011-01-23 – 2011-01-24 (×2): 125 ug via ORAL
  Filled 2011-01-22 (×2): qty 1

## 2011-01-22 MED ORDER — MORPHINE SULFATE 2 MG/ML IJ SOLN
INTRAMUSCULAR | Status: AC
  Filled 2011-01-22: qty 2

## 2011-01-22 MED ORDER — DIGOXIN 250 MCG PO TABS
250.0000 ug | ORAL_TABLET | Freq: Every day | ORAL | Status: DC
  Administered 2011-01-23 – 2011-01-24 (×2): 250 ug via ORAL
  Filled 2011-01-22 (×2): qty 1

## 2011-01-22 MED ORDER — ACETAMINOPHEN 325 MG PO TABS
650.0000 mg | ORAL_TABLET | Freq: Four times a day (QID) | ORAL | Status: DC | PRN
  Administered 2011-01-23 – 2011-01-24 (×2): 650 mg via ORAL
  Filled 2011-01-22 (×2): qty 2

## 2011-01-22 MED ORDER — GLIMEPIRIDE 4 MG PO TABS: 4.0000 mg | ORAL_TABLET | Freq: Every day | ORAL | Status: DC

## 2011-01-22 MED ORDER — METFORMIN HCL 500 MG PO TABS
500.0000 mg | ORAL_TABLET | Freq: Two times a day (BID) | ORAL | Status: DC
  Administered 2011-01-23 – 2011-01-24 (×3): 500 mg via ORAL
  Filled 2011-01-22 (×4): qty 1

## 2011-01-22 MED ORDER — ZOLPIDEM TARTRATE 5 MG PO TABS: 5.0000 mg | ORAL_TABLET | Freq: Every evening | ORAL | Status: DC | PRN

## 2011-01-22 MED ORDER — GLYBURIDE 5 MG PO TABS
10.0000 mg | ORAL_TABLET | Freq: Two times a day (BID) | ORAL | Status: DC
  Administered 2011-01-23 – 2011-01-24 (×3): 10 mg via ORAL
  Filled 2011-01-22 (×4): qty 2

## 2011-01-22 MED ORDER — ACETAMINOPHEN 650 MG RE SUPP: 650.0000 mg | Freq: Four times a day (QID) | RECTAL | Status: DC | PRN

## 2011-01-22 MED ORDER — MORPHINE SULFATE 2 MG/ML IJ SOLN: 4.0000 mg | INTRAMUSCULAR | Status: DC | PRN

## 2011-01-22 MED ORDER — ONDANSETRON HCL 4 MG/2ML IJ SOLN
4.0000 mg | Freq: Once | INTRAMUSCULAR | Status: AC
  Administered 2011-01-22: 4 mg via INTRAVENOUS
  Filled 2011-01-22: qty 2

## 2011-01-22 MED ORDER — DOCUSATE SODIUM 100 MG PO CAPS
100.0000 mg | ORAL_CAPSULE | Freq: Two times a day (BID) | ORAL | Status: DC
  Administered 2011-01-23 – 2011-01-24 (×3): 100 mg via ORAL
  Filled 2011-01-22 (×5): qty 1

## 2011-01-22 MED ORDER — ONDANSETRON HCL 4 MG/2ML IJ SOLN: 4.0000 mg | Freq: Four times a day (QID) | INTRAMUSCULAR | Status: DC | PRN

## 2011-01-22 MED ORDER — ROSUVASTATIN CALCIUM 20 MG PO TABS
20.0000 mg | ORAL_TABLET | Freq: Every day | ORAL | Status: DC
  Administered 2011-01-23: 20 mg via ORAL
  Filled 2011-01-22 (×2): qty 1

## 2011-01-22 MED ORDER — ADULT MULTIVITAMIN W/MINERALS CH
1.0000 | ORAL_TABLET | Freq: Every day | ORAL | Status: DC
  Administered 2011-01-23 – 2011-01-24 (×2): 1 via ORAL
  Filled 2011-01-22 (×2): qty 1

## 2011-01-22 NOTE — H&P (Signed)
PCP:   Ginette Otto, MD, MD   Consultants: Ranee Gosselin, orthopedic surgery  Chief Complaint:  Fall  HPI: Patient is a 76 year old white female past medical history of hypertension and diabetes who presented after she fell on her right side when she tripped while her deck. She came into the emergency room and she was unable to stand and walk and was evaluated. While she did not have any hip fracture seen on x-ray, she was noted to have a superior and inferior pubic rami fracture. The rest of her labs and vitals were stable. Hospitals were called for admission.  Review of Systems:  I saw the patient, she was doing okay. Her pain medication had worked and she was in no distress. She denies any headaches, vision changes, chest pain, palpitations, shortness of breath, wheeze, cough, abdominal pain, hematuria, dysuria, constipation, diarrhea, focal extremity numbness weakness or pain currently. Lungs she does not move right now, she is not in any pain. Review systems otherwise negative.  Past Medical History: Past Medical History  Diagnosis Date  . Cancer   . Diabetes mellitus    Past Surgical History  Procedure Date  . Breast surgery     Medications: Prior to Admission medications   Medication Sig Start Date End Date Taking? Authorizing Provider  atorvastatin (LIPITOR) 40 MG tablet Take 40 mg by mouth daily.     Yes Historical Provider, MD  digoxin (LANOXIN) 0.25 MG tablet Take 250 mcg by mouth daily.     Yes Historical Provider, MD  glyBURIDE (DIABETA) 5 MG tablet Take 10 mg by mouth 2 (two) times daily with a meal.     Yes Historical Provider, MD  levothyroxine (SYNTHROID, LEVOTHROID) 125 MCG tablet Take 125 mcg by mouth daily.     Yes Historical Provider, MD  metFORMIN (GLUCOPHAGE) 500 MG tablet Take 500 mg by mouth 2 (two) times daily with a meal. NO INSTRUCTIONS GIVEN   Yes Historical Provider, MD  Multiple Vitamin (MULITIVITAMIN WITH MINERALS) TABS Take 1 tablet by mouth  daily.     Yes Historical Provider, MD  Ethelda Chick (OYSTER CALCIUM) 500 MG TABS Take 500 mg of elemental calcium by mouth 2 (two) times daily.     Yes Historical Provider, MD  pioglitazone (ACTOS) 15 MG tablet Take 15 mg by mouth daily.     Yes Historical Provider, MD    Allergies:   Allergies  Allergen Reactions  . Erythromycin Hives    Social History:  reports that she has been smoking, about 2 packs of cigarettes a week.  She does not have any smokeless tobacco history on file. She reports that she does not drink alcohol or use illicit drugs. patient lives at home by herself. She is normally able to participate in full activities of daily living without difficulty.  Family History: Hypertension  Physical Exam: Filed Vitals:   01/22/11 1623 01/22/11 1938  BP: 131/78 143/59  Pulse: 83 79  Temp: 98.1 F (36.7 C) 98 F (36.7 C)  TempSrc: Oral Oral  Resp: 20 18  SpO2: 100% 94%   General: Alert and oriented x3, no apparent distress, looks younger than stated age HEENT: Normocephalic, atraumatic, mucous membranes are slightly dry Cardiovascular: Regular rate and rhythm S1-S2 Lungs:" Bilaterally Abdomen: Soft, nontender, nondistended, positive bowel sounds Extremities: No clubbing cyanosis, trace pitting edema Musculoskeletal exam: Deferred secondary fracture  Labs on Admission:   [facility] 01/22/11 2045  NA 139  K 3.7  CL 102  CO2 27  GLUCOSE  171*  BUN 16  CREATININE 0.46*  CALCIUM 10.1  MG --  PHOS --    Basename 01/22/11 2045  WBC 10.1  NEUTROABS 7.6  HGB 12.5  HCT 37.9  MCV 92.4  PLT 148*   Radiological Exams on Admission: Dg Chest 1 View 01/22/2011   IMPRESSION: No acute cardiopulmonary findings.  Dg Hip Complete Right 01/22/2011  IMPRESSION:  1.  No acute hip fracture. 2.  Superior and inferior pubic rami fractures.   Assessment/Plan Present on Admission:  .HYPOTHYROIDISM: Continue Synthroid.  Marland KitchenDIABETES MELLITUS, TYPE II: Continue oral  medications plus sliding-scale.  .OSTEOPOROSIS: Continue supplement.  Marland KitchenHYPERTENSION: Continue antihypertensive medications.  .Multiple pelvic fractures: Orthopedics is consults and and will see the patient the morning. In the short-term, they're recommending SCD's and 4 bed rest. Patient declined a Foley and will use bedpan.  Recheck hemoglobin in the morning. Symptom control.  I have discussed with the patient and she wishes to be a DO NOT RESUSCITATE. Anticipate length of stay for approximately 2-3 days. Unclear if she will be able to go home versus to a short-term skilled nursing facility for rehabilitation.  Merrisa Skorupski K 01/22/2011, 10:24 PM

## 2011-01-22 NOTE — ED Provider Notes (Signed)
History     CSN: [id number] Arrival date & time: 01/22/2011  4:20 PM   First MD Initiated Contact with Patient 01/22/11 1919      Chief Complaint  Patient presents with  . Fall    (Consider location/radiation/quality/duration/timing/severity/associated sxs/prior treatment) HPI Patient fell this afternoon landing on her right side. Since then the patient states she's been unable to bear weight on her right leg. She has history of osteoporosis. The pain seems to be primarily in the right hip and right groin region. It increases with movement and walking.  Patient denies any head injury. She denies any numbness or weakness.   Past Medical History  Diagnosis Date  . Cancer   . Diabetes mellitus     Past Surgical History  Procedure Date  . Breast surgery     No family history on file.  History  Substance Use Topics  . Smoking status: Current Everyday Smoker  . Smokeless tobacco: Not on file  . Alcohol Use: No    OB History    Grav Para Term Preterm Abortions TAB SAB Ect Mult Living                  Review of Systems  All other systems reviewed and are negative.    Allergies  Erythromycin  Home Medications   Current Outpatient Rx  Name Route Sig Dispense Refill  . ATORVASTATIN CALCIUM 40 MG PO TABS Oral Take 40 mg by mouth daily.      Marland Kitchen DIGOXIN 0.25 MG PO TABS Oral Take 250 mcg by mouth daily.      Marland Kitchen GLIMEPIRIDE 4 MG PO TABS Oral Take 4 mg by mouth daily before breakfast.     . GLYBURIDE 5 MG PO TABS Oral Take 10 mg by mouth 2 (two) times daily with a meal.      . LEVOTHYROXINE SODIUM 125 MCG PO TABS Oral Take 125 mcg by mouth daily.      Marland Kitchen METFORMIN HCL 500 MG PO TABS Oral Take 500 mg by mouth 2 (two) times daily with a meal. NO INSTRUCTIONS GIVEN    . ADULT MULTIVITAMIN W/MINERALS CH Oral Take 1 tablet by mouth daily.      Jeralyn Bennett CALCIUM 500 MG PO TABS Oral Take 500 mg of elemental calcium by mouth 2 (two) times daily.      Marland Kitchen PIOGLITAZONE HCL 15 MG PO  TABS Oral Take 15 mg by mouth daily.        BP 143/59  Pulse 79  Temp(Src) 98 F (36.7 C) (Oral)  Resp 18  SpO2 94%  Physical Exam  Nursing note and vitals reviewed. Constitutional: She appears well-developed and well-nourished. No distress.  HENT:  Head: Normocephalic and atraumatic.  Right Ear: External ear normal.  Left Ear: External ear normal.  Eyes: Conjunctivae are normal. Right eye exhibits no discharge. Left eye exhibits no discharge. No scleral icterus.  Neck: Neck supple. No tracheal deviation present.  Cardiovascular: Normal rate, regular rhythm and intact distal pulses.   Pulmonary/Chest: Effort normal and breath sounds normal. No stridor. No respiratory distress. She has no wheezes. She has no rales.  Abdominal: Soft. Bowel sounds are normal. She exhibits no distension. There is no tenderness. There is no rebound and no guarding.  Musculoskeletal: She exhibits no edema and no tenderness.       Right hip: She exhibits decreased range of motion, tenderness and bony tenderness. She exhibits normal strength, no swelling and no deformity.  PAIN WITH RANGE OF MOTION, RIGHT HIP  Neurological: She is alert. She has normal strength. No sensory deficit. Cranial nerve deficit:  no gross defecits noted. She exhibits normal muscle tone. She displays no seizure activity. Coordination normal.  Skin: Skin is warm and dry. No rash noted.  Psychiatric: She has a normal mood and affect.    ED Course  Procedures (including critical care time)  Medications  glimepiride (AMARYL) 4 MG tablet (not administered)  levothyroxine (SYNTHROID, LEVOTHROID) 125 MCG tablet (not administered)  Pioglitazone HCl (ACTOS PO) (not administered)  Oyster Shell (OYSTER CALCIUM) 500 MG TABS (not administered)  Multiple Vitamin (MULITIVITAMIN WITH MINERALS) TABS (not administered)  atorvastatin (LIPITOR) 40 MG tablet (not administered)  pioglitazone (ACTOS) 15 MG tablet (not administered)  digoxin  (LANOXIN) 0.25 MG tablet (not administered)  glyBURIDE (DIABETA) 5 MG tablet (not administered)  morphine 2 MG/ML injection (   Not Given 01/22/11 2123)  morphine 4 MG/ML injection 4 mg (4 mg Intravenous Given 01/22/11 2106)  ondansetron (ZOFRAN) injection 4 mg (4 mg Intravenous Given 01/22/11 2102)    Labs Reviewed  CBC - Abnormal; Notable for the following:    Platelets 148 (*)    All other components within normal limits  DIFFERENTIAL - Abnormal; Notable for the following:    Monocytes Absolute 1.1 (*)    All other components within normal limits  BASIC METABOLIC PANEL - Abnormal; Notable for the following:    Glucose, Bld 171 (*)    Creatinine, Ser 0.46 (*)    All other components within normal limits  PROTIME-INR   Dg Chest 1 View  01/22/2011  *RADIOLOGY REPORT*  Clinical Data: Chest pain.  CHEST - 1 VIEW  Comparison: Chest CT 02/09/2009.  Findings: The cardiac silhouette, mediastinal and hilar contours are within normal limits and stable.  There is mild tortuosity and calcification of the thoracic aorta.  The lungs are clear.  No pleural effusion.  The bony thorax is intact.  IMPRESSION: No acute cardiopulmonary findings.  Original Report Authenticated By: P. Loralie Champagne, M.D.   Dg Hip Complete Right  01/22/2011  *RADIOLOGY REPORT*  Clinical Data: Larey Seat.  RIGHT HIP - COMPLETE 2+ VIEW  Comparison: None  Findings: Both hips are normally located.  No acute hip fracture. Mild hip joint degenerative changes bilaterally.  The pubic symphysis and SI joints are intact.  There are superior and inferior pubic rami fractures on the right side.  IMPRESSION:  1.  No acute hip fracture. 2.  Superior and inferior pubic rami fractures.  Original Report Authenticated By: P. Loralie Champagne, M.D.     1. Closed pelvic fracture       MDM  Patient has sustained superior and inferior pubic rami fractures. These fractures will not require any operative intervention however the patient has  difficulty ambulating. She does at home. Patient will be admitted for pain management and therapy.        Celene Kras, MD 01/22/11 [phone]

## 2011-01-22 NOTE — ED Notes (Signed)
Pt in with right hip and pelvis pain states fell this afternoon landing on right side

## 2011-01-23 LAB — CBC
Hemoglobin: 12.5 g/dL (ref 12.0–15.0)
MCHC: 33.1 g/dL (ref 30.0–36.0)
Platelets: 153 10*3/uL (ref 150–400)
WBC: 6.8 10*3/uL (ref 4.0–10.5)

## 2011-01-23 LAB — GLUCOSE, CAPILLARY
Glucose-Capillary: 175 mg/dL — ABNORMAL HIGH (ref 70–99)
Glucose-Capillary: 251 mg/dL — ABNORMAL HIGH (ref 70–99)
Glucose-Capillary: 215 mg/dL — ABNORMAL HIGH (ref 70–99)

## 2011-01-23 LAB — BASIC METABOLIC PANEL
BUN: 15 mg/dL (ref 6–23)
CO2: 29 mEq/L (ref 19–32)
Calcium: 9.6 mg/dL (ref 8.4–10.5)
Creatinine, Ser: 0.52 mg/dL (ref 0.50–1.10)

## 2011-01-23 MED ORDER — MORPHINE SULFATE 2 MG/ML IJ SOLN: 1.0000 mg | Freq: Four times a day (QID) | INTRAMUSCULAR | Status: DC | PRN

## 2011-01-23 MED ORDER — ASPIRIN 325 MG PO TABS
325.0000 mg | ORAL_TABLET | Freq: Two times a day (BID) | ORAL | Status: DC
  Administered 2011-01-23 – 2011-01-24 (×3): 325 mg via ORAL
  Filled 2011-01-23 (×4): qty 1

## 2011-01-23 NOTE — Progress Notes (Signed)
Inpatient Diabetes Program Recommendations  AACE/ADA: New Consensus Statement on Inpatient Glycemic Control (2009)  Target Ranges:  Prepandial:   less than 140 mg/dL      Peak postprandial:   less than 180 mg/dL (1-2 hours)      Critically ill patients:  140 - 180 mg/dL   Reason for Visit: Elevated CBG's 175 and 251 mg/dL.  Please check HgBA1c to determine glycemic control for past 2-3 months.  Inpatient Diabetes Program Recommendations Insulin - Basal: Consider adding Lantus 10 units daily. Oral Agents: Note per Medication reconciliation, pt. was on both glyburide and glymepiride which are both sulfonylureas.  Consider discontinuation of one of theses medications at discharge since they are in the same class.    Note: Patient is only on glyburide, metformin and Actos oral agents at this time in the hospital.

## 2011-01-23 NOTE — Progress Notes (Signed)
Physical Therapy Evaluation Patient Details Name: [name] MRN: [id number] DOB: 07-31-1934 Today's Date: 01/23/2011  [id number] EVII  Problem List:  Patient Active Problem List  Diagnoses  . HYPOTHYROIDISM  . DIABETES MELLITUS, TYPE II  . HYPERTENSION  . HEEL PAIN, LEFT  . OSTEOPOROSIS  . Hx Breast cancer, IDC, Stage II, right, receptor +, her 2 neg.  . Multiple pelvic fractures    Past Medical History:  Past Medical History  Diagnosis Date  . Cancer   . Diabetes mellitus    Past Surgical History:  Past Surgical History  Procedure Date  . Breast surgery     PT Assessment/Plan/Recommendation PT Assessment Clinical Impression Statement: Pt presents with multiple pelvic fractures with pain limiting full motion.  Tolerated minor ambulation and transfer to 3in1 well.  Pt will benefit from acute skilled PT to address deficits.  Recommended HHPT for further therapy.  PT Recommendation/Assessment: Patient will need skilled PT in the acute care venue PT Problem List: Decreased strength;Decreased range of motion;Pain;Decreased mobility Barriers to Discharge: None PT Therapy Diagnosis : Difficulty walking;Abnormality of gait;Acute pain PT Plan PT Frequency: Min 5X/week PT Treatment/Interventions: Gait training;Stair training;Functional mobility training;Therapeutic activities;Therapeutic exercise;Patient/family education PT Recommendation Follow Up Recommendations: Home health PT Equipment Recommended: None recommended by PT PT Goals  Acute Rehab PT Goals PT Goal Formulation: With patient Time For Goal Achievement: 2 weeks Pt will go Supine/Side to Sit: with modified independence PT Goal: Supine/Side to Sit - Progress: Progressing toward goal Pt will go Sit to Supine/Side: with modified independence PT Goal: Sit to Supine/Side - Progress: Other (comment) Pt will go Sit to Stand: with modified independence PT Goal: Sit to Stand - Progress: Progressing toward goal Pt  will Ambulate: >150 feet;with modified independence;with rolling walker PT Goal: Ambulate - Progress: Progressing toward goal Pt will Go Up / Down Stairs: 1-2 stairs;with supervision;with least restrictive assistive device;with rail(s) PT Goal: Up/Down Stairs - Progress: Other (comment)  PT Evaluation Precautions/Restrictions    Prior Functioning  Home Living Lives With: Friend(s) Receives Help From: Friend (will have 24 hr assist/supervision) Type of Home: House Home Layout: One level Home Access: Stairs to enter Entrance Stairs-Rails:  (grab bar) Entrance Stairs-Number of Steps: 2 Home Adaptive Equipment: Wheelchair - manual;Walker - rolling;Bedside commode/3-in-1 (toilet riser) Prior Function Level of Independence: Independent with homemaking with ambulation;Independent with gait;Independent with transfers Driving: Yes 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 Arousal/Alertness: Awake/alert Overall Cognitive Status: Appears within functional limits for tasks assessed Sensation/Coordination   Extremity Assessment RLE Assessment RLE Assessment: Exceptions to [facility] RLE AROM (degrees) RLE Overall AROM Comments: grossly WFL, limited by pain at R hip/groin LLE Assessment LLE Assessment: Within Functional Limits Mobility (including Balance) Bed Mobility Bed Mobility: Yes Supine to Sit: 4: Min assist;HOB elevated (Comment degrees) Supine to Sit Details (indicate cue type and reason): requires assist for RLE Sitting - Scoot to Edge of Bed: 4: Min assist Sitting - Scoot to [place] of Bed Details (indicate cue type and reason): requires assist for RLE Transfers Transfers: Yes Sit to Stand: 5: Supervision;With upper extremity assist;From bed;From chair/3-in-1;With armrests Sit to Stand Details (indicate cue type and reason): required cues for slight impulsivity Stand to Sit: 5: Supervision;With upper extremity assist;With armrests;To chair/3-in-1 Stand to Sit  Details: required cues for hand placement and sequencing for walker, LE placement on chair Ambulation/Gait Ambulation/Gait: Yes Ambulation/Gait Assistance: 4: Min assist Ambulation/Gait Assistance Details (indicate cue type and reason): required cues for RW placement and sequencing for RW use Ambulation  Distance (Feet): 5 Feet Assistive device: Rolling walker Gait Pattern: Step-to pattern Gait velocity: decreased velocity Stairs: No Wheelchair Mobility Wheelchair Mobility: No    Exercise    End of Session PT - End of Session Activity Tolerance: Patient tolerated treatment well Patient left: in chair;with call bell in reach;with family/visitor present General Behavior During Session: [facility] for tasks performed Cognition: [facility] for tasks performed  Page, Meribeth Mattes 01/23/2011, 3:59 PM

## 2011-01-23 NOTE — Consult Note (Signed)
Reason for Consult:FX Pelvis on Right Referring Physician: Hospitalist  HPI: [name] is an 76 y.o. female Larey Seat at home and injured her Right Pubic area  Past Medical History  Diagnosis Date  . Cancer   . Diabetes mellitus     Past Surgical History  Procedure Date  . Breast surgery     No family history on file.  Social History:  reports that she has been smoking.  She does not have any smokeless tobacco history on file. She reports that she does not drink alcohol or use illicit drugs.  Allergies:  Allergies  Allergen Reactions  . Erythromycin Hives    Medications: I have reviewed the patient's current medications.  Results for orders placed during the hospital encounter of 01/22/11 (from the past 48 hour(s))  CBC     Status: Abnormal   Collection Time   01/22/11  8:45 PM      Component Value Range Comment   WBC 10.1  4.0 - 10.5 (K/uL)    RBC 4.10  3.87 - 5.11 (MIL/uL)    Hemoglobin 12.5  12.0 - 15.0 (g/dL)    HCT 16.1  09.6 - 04.5 (%)    MCV 92.4  78.0 - 100.0 (fL)    MCH 30.5  26.0 - 34.0 (pg)    MCHC 33.0  30.0 - 36.0 (g/dL)    RDW 40.9  81.1 - 91.4 (%)    Platelets 148 (*) 150 - 400 (K/uL)   DIFFERENTIAL     Status: Abnormal   Collection Time   01/22/11  8:45 PM      Component Value Range Comment   Neutrophils Relative 76  43 - 77 (%)    Neutro Abs 7.6  1.7 - 7.7 (K/uL)    Lymphocytes Relative 12  12 - 46 (%)    Lymphs Abs 1.2  0.7 - 4.0 (K/uL)    Monocytes Relative 11  3 - 12 (%)    Monocytes Absolute 1.1 (*) 0.1 - 1.0 (K/uL)    Eosinophils Relative 0  0 - 5 (%)    Eosinophils Absolute 0.0  0.0 - 0.7 (K/uL)    Basophils Relative 0  0 - 1 (%)    Basophils Absolute 0.0  0.0 - 0.1 (K/uL)   BASIC METABOLIC PANEL     Status: Abnormal   Collection Time   01/22/11  8:45 PM      Component Value Range Comment   Sodium 139  135 - 145 (mEq/L)    Potassium 3.7  3.5 - 5.1 (mEq/L)    Chloride 102  96 - 112 (mEq/L)    CO2 27  19 - 32 (mEq/L)    Glucose, Bld  171 (*) 70 - 99 (mg/dL)    BUN 16  6 - 23 (mg/dL)    Creatinine, Ser 7.82 (*) 0.50 - 1.10 (mg/dL)    Calcium 95.6  8.4 - 10.5 (mg/dL)    GFR calc non Af Amer >90  >90 (mL/min)    GFR calc Af Amer >90  >90 (mL/min)   PROTIME-INR     Status: Normal   Collection Time   01/22/11  8:45 PM      Component Value Range Comment   Prothrombin Time 12.7  11.6 - 15.2 (seconds)    INR 0.93  0.00 - 1.49    GLUCOSE, CAPILLARY     Status: Abnormal   Collection Time   01/22/11 11:29 PM      Component Value Range Comment  Glucose-Capillary 243 (*) 70 - 99 (mg/dL)    Comment 1 Documented in Chart      Comment 2 Notify RN     CBC     Status: Normal   Collection Time   01/23/11  4:18 AM      Component Value Range Comment   WBC 6.8  4.0 - 10.5 (K/uL)    RBC 4.05  3.87 - 5.11 (MIL/uL)    Hemoglobin 12.5  12.0 - 15.0 (g/dL)    HCT 16.1  09.6 - 04.5 (%)    MCV 93.3  78.0 - 100.0 (fL)    MCH 30.9  26.0 - 34.0 (pg)    MCHC 33.1  30.0 - 36.0 (g/dL)    RDW 40.9  81.1 - 91.4 (%)    Platelets 153  150 - 400 (K/uL)   BASIC METABOLIC PANEL     Status: Abnormal   Collection Time   01/23/11  4:18 AM      Component Value Range Comment   Sodium 139  135 - 145 (mEq/L)    Potassium 3.9  3.5 - 5.1 (mEq/L)    Chloride 103  96 - 112 (mEq/L)    CO2 29  19 - 32 (mEq/L)    Glucose, Bld 185 (*) 70 - 99 (mg/dL)    BUN 15  6 - 23 (mg/dL)    Creatinine, Ser 7.82  0.50 - 1.10 (mg/dL)    Calcium 9.6  8.4 - 10.5 (mg/dL)    GFR calc non Af Amer >90  >90 (mL/min)    GFR calc Af Amer >90  >90 (mL/min)     Dg Chest 1 View  01/22/2011  *RADIOLOGY REPORT*  Clinical Data: Chest pain.  CHEST - 1 VIEW  Comparison: Chest CT 02/09/2009.  Findings: The cardiac silhouette, mediastinal and hilar contours are within normal limits and stable.  There is mild tortuosity and calcification of the thoracic aorta.  The lungs are clear.  No pleural effusion.  The bony thorax is intact.  IMPRESSION: No acute cardiopulmonary findings.   Original Report Authenticated By: P. Loralie Champagne, M.D.   Dg Hip Complete Right  01/22/2011  *RADIOLOGY REPORT*  Clinical Data: Larey Seat.  RIGHT HIP - COMPLETE 2+ VIEW  Comparison: None  Findings: Both hips are normally located.  No acute hip fracture. Mild hip joint degenerative changes bilaterally.  The pubic symphysis and SI joints are intact.  There are superior and inferior pubic rami fractures on the right side.  IMPRESSION:  1.  No acute hip fracture. 2.  Superior and inferior pubic rami fractures.  Original Report Authenticated By: P. Loralie Champagne, M.D.    ROS: History of Pulmonary Embolism in the past. Now asymptomatic  Physical Exam: Head: Normocephalic, without obvious abnormality, atraumatic Neck: no adenopathy, no carotid bruit, no JVD, supple, symmetrical, trachea midline and thyroid not enlarged, symmetric, no tenderness/mass/nodules Back: symmetric, no curvature. ROM normal. No CVA tenderness. Resp: clear to auscultation bilaterally Extremities: extremities normal, atraumatic, no cyanosis or edema and Homans sign is negative, no sign of DVT Pulses: 2+ and symmetric Neurologic: Grossly normal Vitals BP 121/73  Pulse 81  Temp(Src) 98.3 F (36.8 C) (Oral)  Resp 18  Ht 5\' 7"  (1.702 m)  Wt 67.9 kg (149 lb 11.1 oz)  BMI 23.45 kg/m2  SpO2 93%  Assessment/Plan: Impression: Fractures of Superior and inferior Pubic Ramae. Treatment: Bed Rest and later ambulate with PT and Walker  [name] A 01/23/2011, 7:24 AM

## 2011-01-23 NOTE — Progress Notes (Signed)
Subjective: Reports the pain is better.  No other specific complaints.  Objective: Vital signs in last 24 hours: Filed Vitals:   01/22/11 1938 01/22/11 2310 01/23/11 0615 01/23/11 1355  BP: 143/59 121/73 92/58 121/68  Pulse: 79 81 65 72  Temp: 98 F (36.7 C) 98.3 F (36.8 C) 97.9 F (36.6 C) 98.5 F (36.9 C)  TempSrc: Oral Oral Oral Oral  Resp: 18 18 16 16   Height:  5\' 7"  (1.702 m)    Weight:  67.9 kg (149 lb 11.1 oz)    SpO2: 94% 93% 97% 96%   Weight change:   Intake/Output Summary (Last 24 hours) at 01/23/11 1610 Last data filed at 01/23/11 1300  Gross per 24 hour  Intake    540 ml  Output   1000 ml  Net   -460 ml    Physical Exam: General: Awake, Oriented, No acute distress. HEENT: EOMI. Neck: Supple CV: S1 and S2 Lungs: Clear to ascultation bilaterally Abdomen: Soft, Nontender, Nondistended, +bowel sounds. Ext: Good pulses. Trace edema.  Lab Results:  [facility] 01/23/11 0418 01/22/11 2045  NA 139 139  K 3.9 3.7  CL 103 102  CO2 29 27  GLUCOSE 185* 171*  BUN 15 16  CREATININE 0.52 0.46*  CALCIUM 9.6 10.1  MG -- --  PHOS -- --   No results found for this basename: AST:2,ALT:2,ALKPHOS:2,BILITOT:2,PROT:2,ALBUMIN:2 in the last 72 hours No results found for this basename: LIPASE:2,AMYLASE:2 in the last 72 hours  Basename 01/23/11 0418 01/22/11 2045  WBC 6.8 10.1  NEUTROABS -- 7.6  HGB 12.5 12.5  HCT 37.8 37.9  MCV 93.3 92.4  PLT 153 148*   No results found for this basename: CKTOTAL:3,CKMB:3,CKMBINDEX:3,TROPONINI:3 in the last 72 hours No components found with this basename: POCBNP:3 No results found for this basename: DDIMER:2 in the last 72 hours No results found for this basename: HGBA1C:2 in the last 72 hours No results found for this basename: CHOL:2,HDL:2,LDLCALC:2,TRIG:2,CHOLHDL:2,LDLDIRECT:2 in the last 72 hours No results found for this basename: TSH,T4TOTAL,FREET3,T3FREE,THYROIDAB in the last 72 hours No results found for this basename:  VITAMINB12:2,FOLATE:2,FERRITIN:2,TIBC:2,IRON:2,RETICCTPCT:2 in the last 72 hours  Micro Results: No results found for this or any previous visit (from the past 240 hour(s)).  Studies/Results: Dg Chest 1 View  01/22/2011  *RADIOLOGY REPORT*  Clinical Data: Chest pain.  CHEST - 1 VIEW  Comparison: Chest CT 02/09/2009.  Findings: The cardiac silhouette, mediastinal and hilar contours are within normal limits and stable.  There is mild tortuosity and calcification of the thoracic aorta.  The lungs are clear.  No pleural effusion.  The bony thorax is intact.  IMPRESSION: No acute cardiopulmonary findings.  Original Report Authenticated By: P. Loralie Champagne, M.D.   Dg Hip Complete Right  01/22/2011  *RADIOLOGY REPORT*  Clinical Data: Larey Seat.  RIGHT HIP - COMPLETE 2+ VIEW  Comparison: None  Findings: Both hips are normally located.  No acute hip fracture. Mild hip joint degenerative changes bilaterally.  The pubic symphysis and SI joints are intact.  There are superior and inferior pubic rami fractures on the right side.  IMPRESSION:  1.  No acute hip fracture. 2.  Superior and inferior pubic rami fractures.  Original Report Authenticated By: P. Loralie Champagne, M.D.    Medications: I have reviewed the patient's current medications. Scheduled Meds:   . aspirin  325 mg Oral BID  . calcium carbonate  500 mg of elemental calcium Oral BID  . digoxin  250 mcg Oral Daily  . docusate  sodium  100 mg Oral BID  . glyBURIDE  10 mg Oral BID WC  . insulin aspart  0-5 Units Subcutaneous QHS  . insulin aspart  0-9 Units Subcutaneous TID WC  . levothyroxine  125 mcg Oral Daily  . metFORMIN  500 mg Oral BID WC  . morphine      .  morphine injection  4 mg Intravenous Once  . mulitivitamin with minerals  1 tablet Oral Daily  . ondansetron (ZOFRAN) IV  4 mg Intravenous Once  . pioglitazone  15 mg Oral Daily  . rosuvastatin  20 mg Oral q1800  . DISCONTD: glimepiride  4 mg Oral QAC breakfast   Continuous  Infusions:  PRN Meds:.acetaminophen, acetaminophen, morphine, ondansetron (ZOFRAN) IV, ondansetron, oxyCODONE, polyethylene glycol, zolpidem  Assessment/Plan: 1. Multiple pelvic fractures, Superior and inferior pubic rami fractures.  Continue management with pain control.  Work with physical therapy.  2. Hypothyroidism.  Continue Synthroid.  3. Uncontrolled, Type 2 DM with complications.  Continue home oral diabetic regimen with sliding scale insulin.  Not well controlled at this time.  Further titration of regimen depending on clinical course.  4. Hypertension.  Stable at this time patient is not on any antihypertensives at home.  5.  Hyperlipidemia.  Continue statin.  6. Osteoporosis.  Continue supplemental calcium.  7.  Prophylaxis.  SCDs.  8.  Disposition, discharge patient home with HHPT likely tomorrow if the pain is controlled.   LOS: 1 day  Zannah Melucci A, MD 01/23/2011, 4:10 PM

## 2011-01-24 LAB — GLUCOSE, CAPILLARY
Glucose-Capillary: 192 mg/dL — ABNORMAL HIGH (ref 70–99)
Glucose-Capillary: 178 mg/dL — ABNORMAL HIGH (ref 70–99)
Glucose-Capillary: 321 mg/dL — ABNORMAL HIGH (ref 70–99)

## 2011-01-24 MED ORDER — OXYCODONE HCL 5 MG PO TABS: 5.0000 mg | ORAL_TABLET | Freq: Four times a day (QID) | ORAL | Status: AC | PRN

## 2011-01-24 MED ORDER — POLYETHYLENE GLYCOL 3350 17 G PO PACK: 17.0000 g | PACK | Freq: Every day | ORAL | Status: AC | PRN

## 2011-01-24 MED ORDER — DSS 100 MG PO CAPS: 100.0000 mg | ORAL_CAPSULE | Freq: Two times a day (BID) | ORAL | Status: AC

## 2011-01-24 NOTE — Progress Notes (Signed)
Subjective: Improving. She feels that she can go home today.   Objective: Vital signs in last 24 hours: Temp:  [98.5 F (36.9 C)-98.6 F (37 C)] 98.5 F (36.9 C) (12/18 0550) Pulse Rate:  [60-72] 60  (12/18 0550) Resp:  [16] 16  (12/18 0550) BP: (117-145)/(68-71) 117/68 mmHg (12/18 0550) SpO2:  [95 %-96 %] 95 % (12/18 0550)  Intake/Output from previous day: 12/17 0701 - 12/18 0700 In: 1380 [P.O.:1380] Out: 1000 [Urine:1000] Intake/Output this shift:     Basename 01/23/11 0418 01/22/11 2045  HGB 12.5 12.5    Basename 01/23/11 0418 01/22/11 2045  WBC 6.8 10.1  RBC 4.05 4.10  HCT 37.8 37.9  PLT 153 148*    Basename 01/23/11 0418 01/22/11 2045  NA 139 139  K 3.9 3.7  CL 103 102  CO2 29 27  BUN 15 16  CREATININE 0.52 0.46*  GLUCOSE 185* 171*  CALCIUM 9.6 10.1    Basename 01/22/11 2045  LABPT --  INR 0.93    Neurologically intact Dorsiflexion/Plantar flexion intact  Assessment/Plan: She will be discharged by her MD today. See me in 2-weeks.   [name] 01/24/2011, 7:31 AM

## 2011-01-24 NOTE — Progress Notes (Signed)
Occupational Therapy Evaluation Patient Details Name: [name] MRN: [id number] DOB: 1934-06-11 Today's Date: 01/24/2011 [id number] EV2 Problem List:  Patient Active Problem List  Diagnoses  . HYPOTHYROIDISM  . DIABETES MELLITUS, TYPE II  . HYPERTENSION  . HEEL PAIN, LEFT  . OSTEOPOROSIS  . Hx Breast cancer, IDC, Stage II, right, receptor +, her 2 neg.  . Multiple pelvic fractures    Past Medical History:  Past Medical History  Diagnosis Date  . Cancer   . Diabetes mellitus    Past Surgical History:  Past Surgical History  Procedure Date  . Breast surgery     OT Assessment/Plan/Recommendation OT Assessment Clinical Impression Statement: Skilled OT recommended to maximize ADL I/safety to supervision level for safe return home w/ HHOT OT Recommendation/Assessment: Patient will need skilled OT in the acute care venue OT Problem List: Decreased knowledge of use of DME or AE;Decreased activity tolerance OT Therapy Diagnosis : Generalized weakness OT Plan OT Frequency: Min 2X/week OT Treatment/Interventions: Self-care/ADL training;DME and/or AE instruction;Therapeutic activities;Patient/family education OT Recommendation Equipment Recommended: None recommended by OT Individuals Consulted Consulted and Agree with Results and Recommendations: Patient OT Goals Acute Rehab OT Goals OT Goal Formulation: With patient Time For Goal Achievement: 2 weeks ADL Goals Pt Will Perform Lower Body Bathing: with supervision;with adaptive equipment;Sit to stand from chair;Sit to stand from bed ADL Goal: Lower Body Bathing - Progress: Progressing toward goals Pt Will Perform Lower Body Dressing: with supervision;Sit to stand from chair;Sit to stand from bed;with adaptive equipment ADL Goal: Lower Body Dressing - Progress: Progressing toward goals Pt Will Transfer to Toilet: with supervision;Raised toilet seat without arms ADL Goal: Toilet Transfer - Progress: Progressing toward goals Pt  Will Perform Toileting - Clothing Manipulation: with supervision;Standing ADL Goal: Toileting - Clothing Manipulation - Progress: Progressing toward goals Pt Will Perform Tub/Shower Transfer: with supervision;Other (comment) (approaching shower in posterior method) ADL Goal: Tub/Shower Transfer - Progress: Not met  OT Evaluation Precautions/Restrictions  Precautions Precautions: Fall Required Braces or Orthoses: No Restrictions Weight Bearing Restrictions: No Prior Functioning Home Living Bathroom Shower/Tub: Walk-in shower Bathroom Toilet: Handicapped height Home Adaptive Equipment: Shower chair with back;Walker - four wheeled;Wheelchair - manual;Other (comment) (toilet riser) Prior Function Level of Independence: Independent with basic ADLs;Independent with homemaking with ambulation;Independent with gait;Independent with transfers ADL ADL Eating/Feeding: Performed;Independent Where Assessed - Eating/Feeding: Bed level Grooming: Simulated;Set up Where Assessed - Grooming: Sitting, bed;Unsupported Upper Body Bathing: Simulated;Set up Where Assessed - Upper Body Bathing: Unsupported;Sitting, bed Lower Body Bathing: Moderate assistance;Simulated Where Assessed - Lower Body Bathing: Sit to stand from bed Upper Body Dressing: Simulated Where Assessed - Upper Body Dressing: Sitting, bed;Unsupported Lower Body Dressing: Performed;Minimal assistance;Moderate assistance Lower Body Dressing Details (indicate cue type and reason): educated pt in use of AE for LB dressing Where Assessed - Lower Body Dressing: Sit to stand from bed Toilet Transfer: Performed;Other (comment) (Minguard A with RW) Toilet Transfer Method: Proofreader: Raised toilet seat with arms (or 3-in-1 over toilet) Toileting - Clothing Manipulation: Simulated;Minimal assistance Where Assessed - Toileting Clothing Manipulation: Sit to stand from 3-in-1 or toilet Toileting - Hygiene:  Simulated;Minimal assistance Where Assessed - Toileting Hygiene: Sit to stand from 3-in-1 or toilet Tub/Shower Transfer: Other (comment) (Educated pt how to safely step into shower posteriorly) Equipment Used: Reacher;Sock aid;Other (comment) (RW, 3:1) Vision/Perception  Vision - History Baseline Vision: Wears glasses all the time Patient Visual Report: No change from baseline Cognition Cognition Arousal/Alertness: Awake/alert Overall Cognitive Status: Appears within functional limits for  tasks assessed Sensation/Coordination   Extremity Assessment RUE Assessment RUE Assessment: Within Functional Limits LUE Assessment LUE Assessment: Within Functional Limits Mobility  Bed Mobility Supine to Sit: 5: Supervision Supine to Sit Details (indicate cue type and reason): educated pt how to use sheet as leg lifter Sitting - Scoot to Edge of Bed: 5: Supervision Transfers Sit to Stand: Other (comment);From chair/3-in-1;From bed;With upper extremity assist (Minguard A w/ RW) Sit to Stand Details (indicate cue type and reason):  VCs for hand placement Stand to Sit: To chair/3-in-1;With armrests;With upper extremity assist (Minguard A w/RW) Stand to Sit Details: VCs for hand placement Exercises   End of Session OT - End of Session Equipment Utilized During Treatment: Gait belt;Other (comment) (RW, 3:1) Activity Tolerance: Patient tolerated treatment well Patient left: in chair;with call bell in reach General Behavior During Session: [facility] for tasks performed Cognition: [facility] for tasks performed   Taresa Montville A [phone]   01/24/2011, 9:53 AM

## 2011-01-24 NOTE — Discharge Summary (Signed)
Discharge Summary  [name] MR#: [id number]  DOB:May 05, 1934  Date of Admission: 01/22/2011 Date of Discharge: 01/24/2011  Patient's PCP: Ginette Otto, MD, MD  Attending Physician:Benoit Meech A  Consults: Dr. Darrelyn Hillock - Ortho  Discharge Diagnoses: Principal Problem: Superior and inferior pubic rami fractures Active Problems:  HYPOTHYROIDISM  DIABETES MELLITUS, TYPE II  HYPERTENSION  OSTEOPOROSIS  Brief Admitting History and Physical 76 year old Caucasian female with history of hypertension, diabetes who presented on 01/22/2011 with a fall and right-sided pain.  Discharge Medications Current Discharge Medication List    START taking these medications   Details  docusate sodium 100 MG CAPS Take 100 mg by mouth 2 (two) times daily. Hold for diarrhea. Qty: 60 capsule, Refills: 0    oxyCODONE (OXY IR/ROXICODONE) 5 MG immediate release tablet Take 1 tablet (5 mg total) by mouth every 6 (six) hours as needed for pain. Qty: 30 tablet, Refills: 0    polyethylene glycol (MIRALAX / GLYCOLAX) packet Take 17 g by mouth daily as needed (Constipation). Qty: 30 each, Refills: 0      CONTINUE these medications which have NOT CHANGED   Details  atorvastatin (LIPITOR) 40 MG tablet Take 40 mg by mouth daily.      digoxin (LANOXIN) 0.25 MG tablet Take 250 mcg by mouth daily.      glyBURIDE (DIABETA) 5 MG tablet Take 10 mg by mouth 2 (two) times daily with a meal.      levothyroxine (SYNTHROID, LEVOTHROID) 125 MCG tablet Take 125 mcg by mouth daily.      metFORMIN (GLUCOPHAGE) 500 MG tablet Take 500 mg by mouth 2 (two) times daily with a meal. NO INSTRUCTIONS GIVEN    Multiple Vitamin (MULITIVITAMIN WITH MINERALS) TABS Take 1 tablet by mouth daily.      Oyster Shell (OYSTER CALCIUM) 500 MG TABS Take 500 mg of elemental calcium by mouth 2 (two) times daily.      pioglitazone (ACTOS) 15 MG tablet Take 15 mg by mouth daily.          Hospital Course: 1. Multiple  pelvic fractures, Superior and inferior pubic rami fractures.  Pain was controlled with opiates.  Patient was evaluated by physical therapy and home health physical therapy was recommended which will be arranged at discharge.  Dr. Darrelyn Hillock with ortho service evaluated the patient and recommended conservative management.  Patient is to followup with Dr. Darrelyn Hillock as outpatient in 2 weeks.  2. Hypothyroidism. Continue Synthroid.   3. Uncontrolled, Type 2 DM with complications. Continue home oral diabetic regimen with sliding scale insulin. Not well controlled at this time. Further titration of regimen to be done as per primary care physician.   4. Hypertension. Stable at this time patient is not on any antihypertensives at home.   5. Hyperlipidemia. Continue statin.   6. Osteoporosis. Continue supplemental calcium.   Day of Discharge BP 117/68  Pulse 60  Temp(Src) 98.5 F (36.9 C) (Oral)  Resp 16  Ht 5\' 7"  (1.702 m)  Wt 67.9 kg (149 lb 11.1 oz)  BMI 23.45 kg/m2  SpO2 95%  Results for orders placed during the hospital encounter of 01/22/11 (from the past 48 hour(s))  CBC     Status: Abnormal   Collection Time   01/22/11  8:45 PM      Component Value Range Comment   WBC 10.1  4.0 - 10.5 (K/uL)    RBC 4.10  3.87 - 5.11 (MIL/uL)    Hemoglobin 12.5  12.0 - 15.0 (g/dL)  HCT 37.9  36.0 - 46.0 (%)    MCV 92.4  78.0 - 100.0 (fL)    MCH 30.5  26.0 - 34.0 (pg)    MCHC 33.0  30.0 - 36.0 (g/dL)    RDW 30.8  65.7 - 84.6 (%)    Platelets 148 (*) 150 - 400 (K/uL)   DIFFERENTIAL     Status: Abnormal   Collection Time   01/22/11  8:45 PM      Component Value Range Comment   Neutrophils Relative 76  43 - 77 (%)    Neutro Abs 7.6  1.7 - 7.7 (K/uL)    Lymphocytes Relative 12  12 - 46 (%)    Lymphs Abs 1.2  0.7 - 4.0 (K/uL)    Monocytes Relative 11  3 - 12 (%)    Monocytes Absolute 1.1 (*) 0.1 - 1.0 (K/uL)    Eosinophils Relative 0  0 - 5 (%)    Eosinophils Absolute 0.0  0.0 - 0.7 (K/uL)     Basophils Relative 0  0 - 1 (%)    Basophils Absolute 0.0  0.0 - 0.1 (K/uL)   BASIC METABOLIC PANEL     Status: Abnormal   Collection Time   01/22/11  8:45 PM      Component Value Range Comment   Sodium 139  135 - 145 (mEq/L)    Potassium 3.7  3.5 - 5.1 (mEq/L)    Chloride 102  96 - 112 (mEq/L)    CO2 27  19 - 32 (mEq/L)    Glucose, Bld 171 (*) 70 - 99 (mg/dL)    BUN 16  6 - 23 (mg/dL)    Creatinine, Ser 9.62 (*) 0.50 - 1.10 (mg/dL)    Calcium 95.2  8.4 - 10.5 (mg/dL)    GFR calc non Af Amer >90  >90 (mL/min)    GFR calc Af Amer >90  >90 (mL/min)   PROTIME-INR     Status: Normal   Collection Time   01/22/11  8:45 PM      Component Value Range Comment   Prothrombin Time 12.7  11.6 - 15.2 (seconds)    INR 0.93  0.00 - 1.49    GLUCOSE, CAPILLARY     Status: Abnormal   Collection Time   01/22/11 11:29 PM      Component Value Range Comment   Glucose-Capillary 243 (*) 70 - 99 (mg/dL)    Comment 1 Documented in Chart      Comment 2 Notify RN     CBC     Status: Normal   Collection Time   01/23/11  4:18 AM      Component Value Range Comment   WBC 6.8  4.0 - 10.5 (K/uL)    RBC 4.05  3.87 - 5.11 (MIL/uL)    Hemoglobin 12.5  12.0 - 15.0 (g/dL)    HCT 84.1  32.4 - 40.1 (%)    MCV 93.3  78.0 - 100.0 (fL)    MCH 30.9  26.0 - 34.0 (pg)    MCHC 33.1  30.0 - 36.0 (g/dL)    RDW 02.7  25.3 - 66.4 (%)    Platelets 153  150 - 400 (K/uL)   BASIC METABOLIC PANEL     Status: Abnormal   Collection Time   01/23/11  4:18 AM      Component Value Range Comment   Sodium 139  135 - 145 (mEq/L)    Potassium 3.9  3.5 - 5.1 (mEq/L)  Chloride 103  96 - 112 (mEq/L)    CO2 29  19 - 32 (mEq/L)    Glucose, Bld 185 (*) 70 - 99 (mg/dL)    BUN 15  6 - 23 (mg/dL)    Creatinine, Ser 1.61  0.50 - 1.10 (mg/dL)    Calcium 9.6  8.4 - 10.5 (mg/dL)    GFR calc non Af Amer >90  >90 (mL/min)    GFR calc Af Amer >90  >90 (mL/min)   GLUCOSE, CAPILLARY     Status: Abnormal   Collection Time   01/23/11  7:52 AM        Component Value Range Comment   Glucose-Capillary 175 (*) 70 - 99 (mg/dL)   GLUCOSE, CAPILLARY     Status: Abnormal   Collection Time   01/23/11 12:03 PM      Component Value Range Comment   Glucose-Capillary 251 (*) 70 - 99 (mg/dL)   GLUCOSE, CAPILLARY     Status: Abnormal   Collection Time   01/23/11  4:38 PM      Component Value Range Comment   Glucose-Capillary 215 (*) 70 - 99 (mg/dL)   GLUCOSE, CAPILLARY     Status: Abnormal   Collection Time   01/23/11  9:17 PM      Component Value Range Comment   Glucose-Capillary 192 (*) 70 - 99 (mg/dL)   GLUCOSE, CAPILLARY     Status: Abnormal   Collection Time   01/24/11  8:08 AM      Component Value Range Comment   Glucose-Capillary 178 (*) 70 - 99 (mg/dL)     Dg Chest 1 View  09/60/4540  *RADIOLOGY REPORT*  Clinical Data: Chest pain.  CHEST - 1 VIEW  Comparison: Chest CT 02/09/2009.  Findings: The cardiac silhouette, mediastinal and hilar contours are within normal limits and stable.  There is mild tortuosity and calcification of the thoracic aorta.  The lungs are clear.  No pleural effusion.  The bony thorax is intact.  IMPRESSION: No acute cardiopulmonary findings.  Original Report Authenticated By: P. Loralie Champagne, M.D.   Dg Hip Complete Right  01/22/2011  *RADIOLOGY REPORT*  Clinical Data: Larey Seat.  RIGHT HIP - COMPLETE 2+ VIEW  Comparison: None  Findings: Both hips are normally located.  No acute hip fracture. Mild hip joint degenerative changes bilaterally.  The pubic symphysis and SI joints are intact.  There are superior and inferior pubic rami fractures on the right side.  IMPRESSION:  1.  No acute hip fracture. 2.  Superior and inferior pubic rami fractures.  Original Report Authenticated By: P. Loralie Champagne, M.D.     Disposition: Home with home health physical therapy and occupational therapy  Diet: Diabetic diet  Activity: Resume as tolerated per physical therapy.   Follow-up Appts: Discharge Orders    Future  Orders Please Complete By Expires   Diet Carb Modified      Increase activity slowly      Discharge instructions      Comments:   Followup with Ginette Otto, MD (PCP) in 1 week. Followup with Dr. Darrelyn Hillock (Ortho) in 2 weeks.       TESTS THAT NEED FOLLOW-UP None  Time spent on discharge, talking to the patient, and coordinating care: 25 mins.   Signed: Cristal Ford, MD 01/24/2011, 11:37 AM

## 2011-01-24 NOTE — Progress Notes (Signed)
[facility] NOTIFIED FOR HHPT/OT.PATIENT AWARE& AGREE.

## 2011-01-24 NOTE — Progress Notes (Signed)
Subjective: Feeling better today.  Pain under control today.  Wants to go home today.  Objective: Vital signs in last 24 hours: Filed Vitals:   01/23/11 0615 01/23/11 1355 01/23/11 2105 01/24/11 0550  BP: 92/58 121/68 145/71 117/68  Pulse: 65 72 64 60  Temp: 97.9 F (36.6 C) 98.5 F (36.9 C) 98.6 F (37 C) 98.5 F (36.9 C)  TempSrc: Oral Oral Oral Oral  Resp: 16 16 16 16   Height:      Weight:      SpO2: 97% 96% 95% 95%   Weight change:   Intake/Output Summary (Last 24 hours) at 01/24/11 1130 Last data filed at 01/24/11 0858  Gross per 24 hour  Intake   1380 ml  Output      0 ml  Net   1380 ml    Physical Exam: General: Awake, Oriented, No acute distress. HEENT: EOMI. Neck: Supple CV: S1 and S2 Lungs: Clear to ascultation bilaterally Abdomen: Soft, Nontender, Nondistended, +bowel sounds. Ext: Good pulses. Trace edema.  Lab Results:  [facility] 01/23/11 0418 01/22/11 2045  NA 139 139  K 3.9 3.7  CL 103 102  CO2 29 27  GLUCOSE 185* 171*  BUN 15 16  CREATININE 0.52 0.46*  CALCIUM 9.6 10.1  MG -- --  PHOS -- --   No results found for this basename: AST:2,ALT:2,ALKPHOS:2,BILITOT:2,PROT:2,ALBUMIN:2 in the last 72 hours No results found for this basename: LIPASE:2,AMYLASE:2 in the last 72 hours  Basename 01/23/11 0418 01/22/11 2045  WBC 6.8 10.1  NEUTROABS -- 7.6  HGB 12.5 12.5  HCT 37.8 37.9  MCV 93.3 92.4  PLT 153 148*   No results found for this basename: CKTOTAL:3,CKMB:3,CKMBINDEX:3,TROPONINI:3 in the last 72 hours No components found with this basename: POCBNP:3 No results found for this basename: DDIMER:2 in the last 72 hours No results found for this basename: HGBA1C:2 in the last 72 hours No results found for this basename: CHOL:2,HDL:2,LDLCALC:2,TRIG:2,CHOLHDL:2,LDLDIRECT:2 in the last 72 hours No results found for this basename: TSH,T4TOTAL,FREET3,T3FREE,THYROIDAB in the last 72 hours No results found for this basename:  VITAMINB12:2,FOLATE:2,FERRITIN:2,TIBC:2,IRON:2,RETICCTPCT:2 in the last 72 hours  Micro Results: No results found for this or any previous visit (from the past 240 hour(s)).  Studies/Results: Dg Chest 1 View  01/22/2011  *RADIOLOGY REPORT*  Clinical Data: Chest pain.  CHEST - 1 VIEW  Comparison: Chest CT 02/09/2009.  Findings: The cardiac silhouette, mediastinal and hilar contours are within normal limits and stable.  There is mild tortuosity and calcification of the thoracic aorta.  The lungs are clear.  No pleural effusion.  The bony thorax is intact.  IMPRESSION: No acute cardiopulmonary findings.  Original Report Authenticated By: P. Loralie Champagne, M.D.   Dg Hip Complete Right  01/22/2011  *RADIOLOGY REPORT*  Clinical Data: Larey Seat.  RIGHT HIP - COMPLETE 2+ VIEW  Comparison: None  Findings: Both hips are normally located.  No acute hip fracture. Mild hip joint degenerative changes bilaterally.  The pubic symphysis and SI joints are intact.  There are superior and inferior pubic rami fractures on the right side.  IMPRESSION:  1.  No acute hip fracture. 2.  Superior and inferior pubic rami fractures.  Original Report Authenticated By: P. Loralie Champagne, M.D.    Medications: I have reviewed the patient's current medications. Scheduled Meds:    . aspirin  325 mg Oral BID  . calcium carbonate  500 mg of elemental calcium Oral BID  . digoxin  250 mcg Oral Daily  . docusate sodium  100 mg Oral BID  . glyBURIDE  10 mg Oral BID WC  . insulin aspart  0-5 Units Subcutaneous QHS  . insulin aspart  0-9 Units Subcutaneous TID WC  . levothyroxine  125 mcg Oral Daily  . metFORMIN  500 mg Oral BID WC  . mulitivitamin with minerals  1 tablet Oral Daily  . pioglitazone  15 mg Oral Daily  . rosuvastatin  20 mg Oral q1800   Continuous Infusions:  PRN Meds:.acetaminophen, acetaminophen, morphine, ondansetron (ZOFRAN) IV, ondansetron, oxyCODONE, polyethylene glycol, zolpidem, DISCONTD:  morphine  Assessment/Plan: 1. Multiple pelvic fractures, Superior and inferior pubic rami fractures.  Continue management with pain control.  Arrange home health physical therapy.  2. Hypothyroidism.  Continue Synthroid.  3. Uncontrolled, Type 2 DM with complications.  Continue home oral diabetic regimen with sliding scale insulin.  Not well controlled at this time.  Further titration of regimen depending on clinical course.  4. Hypertension.  Stable at this time patient is not on any antihypertensives at home.  5.  Hyperlipidemia.  Continue statin.  6. Osteoporosis.  Continue supplemental calcium.  7.  Prophylaxis.  SCDs.  8.  Disposition, discharge patient home with HHPT today.   LOS: 2 days  [name] A, MD 01/24/2011, 11:30 AM

## 2011-03-13 ENCOUNTER — Other Ambulatory Visit: Payer: Self-pay | Admitting: Geriatric Medicine

## 2011-03-13 ENCOUNTER — Other Ambulatory Visit: Payer: Self-pay | Admitting: Oncology

## 2011-03-13 DIAGNOSIS — Z1231 Encounter for screening mammogram for malignant neoplasm of breast: Secondary | ICD-10-CM

## 2011-04-03 ENCOUNTER — Ambulatory Visit
Admission: RE | Admit: 2011-04-03 | Discharge: 2011-04-03 | Disposition: A | Discharge status: Home / Self Care | Payer: Medicare Other | Source: Ambulatory Visit | Attending: Geriatric Medicine | Admitting: Geriatric Medicine

## 2011-04-03 DIAGNOSIS — Z1231 Encounter for screening mammogram for malignant neoplasm of breast: Secondary | ICD-10-CM

## 2011-04-05 ENCOUNTER — Other Ambulatory Visit: Payer: Self-pay | Admitting: Geriatric Medicine

## 2011-04-05 DIAGNOSIS — R928 Other abnormal and inconclusive findings on diagnostic imaging of breast: Secondary | ICD-10-CM

## 2011-04-13 ENCOUNTER — Ambulatory Visit
Admission: RE | Admit: 2011-04-13 | Discharge: 2011-04-13 | Disposition: A | Discharge status: Home / Self Care | Payer: Medicare Other | Source: Ambulatory Visit | Attending: Geriatric Medicine | Admitting: Geriatric Medicine

## 2011-04-13 DIAGNOSIS — R928 Other abnormal and inconclusive findings on diagnostic imaging of breast: Secondary | ICD-10-CM

## 2011-04-25 ENCOUNTER — Ambulatory Visit (INDEPENDENT_AMBULATORY_CARE_PROVIDER_SITE_OTHER): Payer: Medicare Other | Admitting: Ophthalmology

## 2011-04-25 DIAGNOSIS — H35039 Hypertensive retinopathy, unspecified eye: Secondary | ICD-10-CM

## 2011-04-25 DIAGNOSIS — H2510 Age-related nuclear cataract, unspecified eye: Secondary | ICD-10-CM

## 2011-04-25 DIAGNOSIS — I10 Essential (primary) hypertension: Secondary | ICD-10-CM

## 2011-04-25 DIAGNOSIS — E1165 Type 2 diabetes mellitus with hyperglycemia: Secondary | ICD-10-CM

## 2011-04-25 DIAGNOSIS — H33309 Unspecified retinal break, unspecified eye: Secondary | ICD-10-CM

## 2011-04-25 DIAGNOSIS — H35419 Lattice degeneration of retina, unspecified eye: Secondary | ICD-10-CM

## 2011-04-25 DIAGNOSIS — H43819 Vitreous degeneration, unspecified eye: Secondary | ICD-10-CM

## 2011-04-25 DIAGNOSIS — E11319 Type 2 diabetes mellitus with unspecified diabetic retinopathy without macular edema: Secondary | ICD-10-CM

## 2011-05-03 ENCOUNTER — Ambulatory Visit (INDEPENDENT_AMBULATORY_CARE_PROVIDER_SITE_OTHER): Payer: Medicare Other | Admitting: Ophthalmology

## 2011-05-03 DIAGNOSIS — H33309 Unspecified retinal break, unspecified eye: Secondary | ICD-10-CM

## 2011-06-12 ENCOUNTER — Other Ambulatory Visit: Payer: Self-pay | Admitting: Geriatric Medicine

## 2011-06-12 DIAGNOSIS — R51 Headache: Secondary | ICD-10-CM

## 2011-06-13 ENCOUNTER — Ambulatory Visit
Admission: RE | Admit: 2011-06-13 | Discharge: 2011-06-13 | Disposition: A | Discharge status: Home / Self Care | Payer: Medicare Other | Source: Ambulatory Visit | Attending: Geriatric Medicine | Admitting: Geriatric Medicine

## 2011-06-13 DIAGNOSIS — R51 Headache: Secondary | ICD-10-CM

## 2011-09-04 ENCOUNTER — Ambulatory Visit (INDEPENDENT_AMBULATORY_CARE_PROVIDER_SITE_OTHER): Payer: Medicare Other | Admitting: Ophthalmology

## 2011-09-14 ENCOUNTER — Ambulatory Visit (INDEPENDENT_AMBULATORY_CARE_PROVIDER_SITE_OTHER): Payer: Medicare Other | Admitting: Ophthalmology

## 2011-09-14 DIAGNOSIS — H35039 Hypertensive retinopathy, unspecified eye: Secondary | ICD-10-CM

## 2011-09-14 DIAGNOSIS — H33309 Unspecified retinal break, unspecified eye: Secondary | ICD-10-CM

## 2011-09-14 DIAGNOSIS — E11319 Type 2 diabetes mellitus with unspecified diabetic retinopathy without macular edema: Secondary | ICD-10-CM

## 2011-09-14 DIAGNOSIS — H43819 Vitreous degeneration, unspecified eye: Secondary | ICD-10-CM

## 2011-09-18 ENCOUNTER — Ambulatory Visit: Payer: Medicare Other | Attending: Geriatric Medicine | Admitting: Physical Therapy

## 2011-09-18 DIAGNOSIS — R269 Unspecified abnormalities of gait and mobility: Secondary | ICD-10-CM | POA: Insufficient documentation

## 2011-09-18 DIAGNOSIS — IMO0001 Reserved for inherently not codable concepts without codable children: Secondary | ICD-10-CM | POA: Insufficient documentation

## 2011-09-28 ENCOUNTER — Ambulatory Visit: Payer: Medicare Other | Admitting: Physical Therapy

## 2011-09-29 ENCOUNTER — Ambulatory Visit: Payer: Medicare Other | Admitting: Physical Therapy

## 2011-10-02 ENCOUNTER — Ambulatory Visit: Payer: Medicare Other | Admitting: Physical Therapy

## 2011-10-04 ENCOUNTER — Ambulatory Visit: Payer: Medicare Other | Admitting: Physical Therapy

## 2011-10-10 ENCOUNTER — Ambulatory Visit: Payer: Medicare Other | Attending: Geriatric Medicine | Admitting: Physical Therapy

## 2011-10-10 DIAGNOSIS — R269 Unspecified abnormalities of gait and mobility: Secondary | ICD-10-CM | POA: Insufficient documentation

## 2011-10-10 DIAGNOSIS — IMO0001 Reserved for inherently not codable concepts without codable children: Secondary | ICD-10-CM | POA: Insufficient documentation

## 2011-10-12 ENCOUNTER — Ambulatory Visit: Payer: Medicare Other | Admitting: Physical Therapy

## 2011-10-16 ENCOUNTER — Ambulatory Visit: Payer: Medicare Other | Admitting: Physical Therapy

## 2011-10-18 ENCOUNTER — Ambulatory Visit: Payer: Medicare Other | Admitting: Physical Therapy

## 2011-12-27 ENCOUNTER — Other Ambulatory Visit: Payer: Self-pay | Admitting: Geriatric Medicine

## 2011-12-27 DIAGNOSIS — R4701 Aphasia: Secondary | ICD-10-CM

## 2012-01-01 ENCOUNTER — Ambulatory Visit
Admission: RE | Admit: 2012-01-01 | Discharge: 2012-01-01 | Disposition: A | Discharge status: Home / Self Care | Payer: Medicare Other | Source: Ambulatory Visit | Attending: Geriatric Medicine | Admitting: Geriatric Medicine

## 2012-01-01 DIAGNOSIS — R4701 Aphasia: Secondary | ICD-10-CM

## 2012-01-01 MED ORDER — GADOBENATE DIMEGLUMINE 529 MG/ML IV SOLN
13.0000 mL | Freq: Once | INTRAVENOUS | Status: AC | PRN
  Administered 2012-01-01: 13 mL via INTRAVENOUS

## 2012-03-12 ENCOUNTER — Other Ambulatory Visit: Payer: Self-pay | Admitting: Geriatric Medicine

## 2012-03-12 DIAGNOSIS — Z1231 Encounter for screening mammogram for malignant neoplasm of breast: Secondary | ICD-10-CM

## 2012-04-11 ENCOUNTER — Ambulatory Visit: Payer: Medicare Other

## 2012-05-03 ENCOUNTER — Ambulatory Visit: Payer: Medicare Other

## 2012-05-13 ENCOUNTER — Ambulatory Visit
Admission: RE | Admit: 2012-05-13 | Discharge: 2012-05-13 | Disposition: A | Discharge status: Home / Self Care | Payer: Medicare Other | Source: Ambulatory Visit | Attending: Geriatric Medicine | Admitting: Geriatric Medicine

## 2012-05-13 DIAGNOSIS — Z1231 Encounter for screening mammogram for malignant neoplasm of breast: Secondary | ICD-10-CM

## 2012-08-29 ENCOUNTER — Encounter: Payer: Self-pay | Admitting: *Deleted

## 2012-08-29 ENCOUNTER — Encounter: Payer: Medicare Other | Attending: Geriatric Medicine | Admitting: *Deleted

## 2012-08-29 VITALS — Ht 67.0 in | Wt 147.0 lb

## 2012-08-29 DIAGNOSIS — E119 Type 2 diabetes mellitus without complications: Secondary | ICD-10-CM | POA: Insufficient documentation

## 2012-08-29 DIAGNOSIS — Z713 Dietary counseling and surveillance: Secondary | ICD-10-CM | POA: Insufficient documentation

## 2012-08-29 NOTE — Progress Notes (Signed)
Insulin Instruction  Patient was seen on 08/29/2012 for insulin instruction.  The following learning objectives were met by the patient during this visit:   Insulin Action of Levemir insulin  Reviewed syringe & vial VS pen including # units per syringe,    length of needles, vial VS Pen cartridge and needles  Hygiene and storage  Drawing up single dose of 6 units   Rotation of Sites  Hypoglycemia- symptoms, causes , treatment choices  Record keeping and MD follow up   Patient demonstrated understanding of insulin administration by return demonstration.  Patient received the following handouts:  Insulin Instruction Handout  Levemir product handout                                        Patient to start on insulin @ 6 units in evening as Rx'd by MD  Patient will be seen for follow-up as needed.

## 2012-10-04 ENCOUNTER — Ambulatory Visit: Payer: Medicare Other | Admitting: *Deleted

## 2012-10-08 ENCOUNTER — Encounter: Payer: Medicare Other | Attending: Geriatric Medicine | Admitting: *Deleted

## 2012-10-08 DIAGNOSIS — E119 Type 2 diabetes mellitus without complications: Secondary | ICD-10-CM | POA: Insufficient documentation

## 2012-10-08 DIAGNOSIS — Z713 Dietary counseling and surveillance: Secondary | ICD-10-CM | POA: Insufficient documentation

## 2012-10-08 NOTE — Progress Notes (Signed)
  Insulin Instruction Follow Up Appointment Appt start time: 1530 end time:  1800.  Assessment:  Primary concerns today: patient here for follow up visit after starting on Levemir insulin just over one month ago. She states she is doing very well, no problems or concerns. She brought in some BG logs for the past 10 days and the FBG's are decreasing with this morning being a 70 mg/dl. She said she was more active in her yard yesterday, but this is dangerously close to hypoglycemia. She did not have any symptoms of low BG. She and her friend are planning a trip to [place] next month and she had several questions about taking her insulin while traveling  MEDICATIONS: see list. Currently taking Levemir @ 6 units at 11PM nightly  Recent physical activity: enjoys yard work    Intervention: Answered questions regarding recapping pen needles, storage of insulin, monitoring BG values and when to call MD regarding hypoglycemia so dose can be reduced as needed. Also reviewed symptoms and treatment of hypoglycemia especially for use while traveling. Suggested she consider purchasing Medical ID bracelet prior to her trip as well.  Handouts given during visit include:  BG Log Sheet  Monitoring/Evaluation:  prn.

## 2012-12-02 ENCOUNTER — Other Ambulatory Visit: Payer: Self-pay | Admitting: Geriatric Medicine

## 2012-12-02 DIAGNOSIS — R4701 Aphasia: Secondary | ICD-10-CM

## 2012-12-12 ENCOUNTER — Ambulatory Visit
Admission: RE | Admit: 2012-12-12 | Discharge: 2012-12-12 | Disposition: A | Discharge status: Home / Self Care | Payer: BC Managed Care – PPO | Source: Ambulatory Visit | Attending: Geriatric Medicine | Admitting: Geriatric Medicine

## 2012-12-12 DIAGNOSIS — R4701 Aphasia: Secondary | ICD-10-CM

## 2012-12-12 MED ORDER — GADOBENATE DIMEGLUMINE 529 MG/ML IV SOLN
13.0000 mL | Freq: Once | INTRAVENOUS | Status: AC | PRN
  Administered 2012-12-12: 13 mL via INTRAVENOUS

## 2013-07-01 ENCOUNTER — Other Ambulatory Visit: Payer: Self-pay

## 2013-07-01 DIAGNOSIS — Z1231 Encounter for screening mammogram for malignant neoplasm of breast: Secondary | ICD-10-CM

## 2013-07-17 ENCOUNTER — Ambulatory Visit
Admission: RE | Admit: 2013-07-17 | Discharge: 2013-07-17 | Disposition: A | Discharge status: Home / Self Care | Payer: Medicare Other | Source: Ambulatory Visit

## 2013-07-17 ENCOUNTER — Encounter (INDEPENDENT_AMBULATORY_CARE_PROVIDER_SITE_OTHER): Payer: Self-pay

## 2013-07-17 DIAGNOSIS — Z1231 Encounter for screening mammogram for malignant neoplasm of breast: Secondary | ICD-10-CM

## 2014-07-25 ENCOUNTER — Encounter (HOSPITAL_COMMUNITY): Payer: Self-pay | Admitting: *Deleted

## 2014-07-25 ENCOUNTER — Emergency Department (HOSPITAL_COMMUNITY): Payer: Medicare Other

## 2014-07-25 ENCOUNTER — Inpatient Hospital Stay (HOSPITAL_COMMUNITY)
Admission: EM | Admit: 2014-07-25 | Discharge: 2014-07-28 | DRG: 482 | Disposition: A | Discharge status: Skilled Nursing Facility | Payer: Medicare Other | Attending: Internal Medicine | Admitting: Internal Medicine

## 2014-07-25 DIAGNOSIS — Z79899 Other long term (current) drug therapy: Secondary | ICD-10-CM

## 2014-07-25 DIAGNOSIS — M810 Age-related osteoporosis without current pathological fracture: Secondary | ICD-10-CM | POA: Diagnosis present

## 2014-07-25 DIAGNOSIS — S50311A Abrasion of right elbow, initial encounter: Secondary | ICD-10-CM | POA: Diagnosis present

## 2014-07-25 DIAGNOSIS — Z853 Personal history of malignant neoplasm of breast: Secondary | ICD-10-CM

## 2014-07-25 DIAGNOSIS — Z720 Tobacco use: Secondary | ICD-10-CM | POA: Diagnosis present

## 2014-07-25 DIAGNOSIS — W19XXXA Unspecified fall, initial encounter: Secondary | ICD-10-CM

## 2014-07-25 DIAGNOSIS — I10 Essential (primary) hypertension: Secondary | ICD-10-CM | POA: Diagnosis present

## 2014-07-25 DIAGNOSIS — S72141A Displaced intertrochanteric fracture of right femur, initial encounter for closed fracture: Secondary | ICD-10-CM | POA: Diagnosis present

## 2014-07-25 DIAGNOSIS — Z8249 Family history of ischemic heart disease and other diseases of the circulatory system: Secondary | ICD-10-CM

## 2014-07-25 DIAGNOSIS — W010XXA Fall on same level from slipping, tripping and stumbling without subsequent striking against object, initial encounter: Secondary | ICD-10-CM | POA: Diagnosis present

## 2014-07-25 DIAGNOSIS — F17210 Nicotine dependence, cigarettes, uncomplicated: Secondary | ICD-10-CM | POA: Diagnosis present

## 2014-07-25 DIAGNOSIS — E785 Hyperlipidemia, unspecified: Secondary | ICD-10-CM | POA: Diagnosis present

## 2014-07-25 DIAGNOSIS — Z7982 Long term (current) use of aspirin: Secondary | ICD-10-CM

## 2014-07-25 DIAGNOSIS — E039 Hypothyroidism, unspecified: Secondary | ICD-10-CM | POA: Diagnosis present

## 2014-07-25 DIAGNOSIS — M25551 Pain in right hip: Secondary | ICD-10-CM | POA: Diagnosis present

## 2014-07-25 DIAGNOSIS — I69320 Aphasia following cerebral infarction: Secondary | ICD-10-CM

## 2014-07-25 DIAGNOSIS — S72009A Fracture of unspecified part of neck of unspecified femur, initial encounter for closed fracture: Secondary | ICD-10-CM | POA: Diagnosis present

## 2014-07-25 DIAGNOSIS — Z66 Do not resuscitate: Secondary | ICD-10-CM | POA: Diagnosis present

## 2014-07-25 DIAGNOSIS — M25469 Effusion, unspecified knee: Secondary | ICD-10-CM | POA: Diagnosis present

## 2014-07-25 DIAGNOSIS — E119 Type 2 diabetes mellitus without complications: Secondary | ICD-10-CM

## 2014-07-25 DIAGNOSIS — M25461 Effusion, right knee: Secondary | ICD-10-CM | POA: Diagnosis present

## 2014-07-25 DIAGNOSIS — S72001A Fracture of unspecified part of neck of right femur, initial encounter for closed fracture: Secondary | ICD-10-CM | POA: Diagnosis present

## 2014-07-25 DIAGNOSIS — Y92017 Garden or yard in single-family (private) house as the place of occurrence of the external cause: Secondary | ICD-10-CM | POA: Diagnosis not present

## 2014-07-25 DIAGNOSIS — R52 Pain, unspecified: Secondary | ICD-10-CM

## 2014-07-25 DIAGNOSIS — R4701 Aphasia: Secondary | ICD-10-CM | POA: Diagnosis present

## 2014-07-25 DIAGNOSIS — Z419 Encounter for procedure for purposes other than remedying health state, unspecified: Secondary | ICD-10-CM

## 2014-07-25 HISTORY — DX: Tobacco use: Z72.0

## 2014-07-25 HISTORY — DX: Aphasia: R47.01

## 2014-07-25 LAB — BASIC METABOLIC PANEL
Anion gap: 7 (ref 5–15)
BUN: 24 mg/dL — ABNORMAL HIGH (ref 6–20)
CALCIUM: 9.6 mg/dL (ref 8.9–10.3)
CO2: 25 mmol/L (ref 22–32)
Chloride: 109 mmol/L (ref 101–111)
Creatinine, Ser: 0.84 mg/dL (ref 0.44–1.00)
GFR calc Af Amer: >60 mL/min (ref >60)
GLUCOSE: 238 mg/dL — AB (ref 65–99)
POTASSIUM: 4.1 mmol/L (ref 3.5–5.1)
SODIUM: 141 mmol/L (ref 135–145)

## 2014-07-25 LAB — CBC WITH DIFFERENTIAL/PLATELET
Basophils Absolute: 0 10*3/uL (ref 0.0–0.1)
Basophils Relative: 0 % (ref 0–1)
Eosinophils Absolute: 0 10*3/uL (ref 0.0–0.7)
Eosinophils Relative: 0 % (ref 0–5)
HEMATOCRIT: 41.2 % (ref 36.0–46.0)
Hemoglobin: 13.9 g/dL (ref 12.0–15.0)
LYMPHS PCT: 13 % (ref 12–46)
Lymphs Abs: 1.2 10*3/uL (ref 0.7–4.0)
MCH: 31.1 pg (ref 26.0–34.0)
MCHC: 33.7 g/dL (ref 30.0–36.0)
MCV: 92.2 fL (ref 78.0–100.0)
Monocytes Absolute: 0.7 10*3/uL (ref 0.1–1.0)
Monocytes Relative: 7 % (ref 3–12)
NEUTROS ABS: 7.4 10*3/uL (ref 1.7–7.7)
Neutrophils Relative %: 80 % — ABNORMAL HIGH (ref 43–77)
PLATELETS: 163 10*3/uL (ref 150–400)
RBC: 4.47 MIL/uL (ref 3.87–5.11)
RDW: 12.7 % (ref 11.5–15.5)
WBC: 9.3 10*3/uL (ref 4.0–10.5)

## 2014-07-25 LAB — PROTIME-INR
INR: 0.98 (ref 0.00–1.49)
Prothrombin Time: 13.2 seconds (ref 11.6–15.2)

## 2014-07-25 LAB — TYPE AND SCREEN
ABO/RH(D): A POS
Antibody Screen: NEGATIVE

## 2014-07-25 LAB — GLUCOSE, CAPILLARY: GLUCOSE-CAPILLARY: 229 mg/dL — AB (ref 65–99)

## 2014-07-25 LAB — ABO/RH: ABO/RH(D): A POS

## 2014-07-25 MED ORDER — DONEPEZIL HCL 10 MG PO TABS
10.0000 mg | ORAL_TABLET | Freq: Every day | ORAL | Status: DC
  Administered 2014-07-25 – 2014-07-27 (×3): 10 mg via ORAL
  Filled 2014-07-25 (×4): qty 1

## 2014-07-25 MED ORDER — CALCIUM CARBONATE-VITAMIN D 500-200 MG-UNIT PO TABS
1.0000 | ORAL_TABLET | Freq: Two times a day (BID) | ORAL | Status: DC
  Administered 2014-07-26 – 2014-07-28 (×4): 1 via ORAL
  Filled 2014-07-25 (×7): qty 1

## 2014-07-25 MED ORDER — ONDANSETRON HCL 4 MG PO TABS: 4.0000 mg | ORAL_TABLET | Freq: Four times a day (QID) | ORAL | Status: DC | PRN

## 2014-07-25 MED ORDER — OXYCODONE-ACETAMINOPHEN 5-325 MG PO TABS
1.0000 | ORAL_TABLET | ORAL | Status: DC | PRN
  Administered 2014-07-27: 1 via ORAL
  Filled 2014-07-25 (×2): qty 1

## 2014-07-25 MED ORDER — NICOTINE 21 MG/24HR TD PT24
21.0000 mg | MEDICATED_PATCH | Freq: Every day | TRANSDERMAL | Status: DC
  Administered 2014-07-27 – 2014-07-28 (×2): 21 mg via TRANSDERMAL
  Filled 2014-07-25 (×4): qty 1

## 2014-07-25 MED ORDER — ALUM & MAG HYDROXIDE-SIMETH 200-200-20 MG/5ML PO SUSP: 30.0000 mL | Freq: Four times a day (QID) | ORAL | Status: DC | PRN

## 2014-07-25 MED ORDER — MORPHINE SULFATE 4 MG/ML IJ SOLN
4.0000 mg | Freq: Once | INTRAMUSCULAR | Status: AC
  Administered 2014-07-25: 4 mg via INTRAVENOUS
  Filled 2014-07-25: qty 1

## 2014-07-25 MED ORDER — INSULIN ASPART 100 UNIT/ML ~~LOC~~ SOLN
0.0000 [IU] | Freq: Three times a day (TID) | SUBCUTANEOUS | Status: DC
  Administered 2014-07-26: 2 [IU] via SUBCUTANEOUS
  Administered 2014-07-26 (×2): 3 [IU] via SUBCUTANEOUS
  Administered 2014-07-27 (×2): 5 [IU] via SUBCUTANEOUS
  Administered 2014-07-27: 2 [IU] via SUBCUTANEOUS
  Administered 2014-07-28 (×2): 3 [IU] via SUBCUTANEOUS

## 2014-07-25 MED ORDER — MAGNESIUM GLUCONATE 500 MG PO TABS
1000.0000 mg | ORAL_TABLET | Freq: Every day | ORAL | Status: DC
  Administered 2014-07-27 – 2014-07-28 (×2): 1000 mg via ORAL
  Filled 2014-07-25 (×3): qty 2

## 2014-07-25 MED ORDER — MORPHINE SULFATE 2 MG/ML IJ SOLN
2.0000 mg | INTRAMUSCULAR | Status: DC | PRN
  Administered 2014-07-25 – 2014-07-27 (×3): 2 mg via INTRAVENOUS
  Filled 2014-07-25 (×4): qty 1

## 2014-07-25 MED ORDER — ONDANSETRON HCL 4 MG/2ML IJ SOLN: 4.0000 mg | Freq: Four times a day (QID) | INTRAMUSCULAR | Status: DC | PRN

## 2014-07-25 MED ORDER — ADULT MULTIVITAMIN W/MINERALS CH
1.0000 | ORAL_TABLET | Freq: Every day | ORAL | Status: DC
  Administered 2014-07-25 – 2014-07-28 (×3): 1 via ORAL
  Filled 2014-07-25 (×4): qty 1

## 2014-07-25 MED ORDER — BACITRACIN ZINC 500 UNIT/GM EX OINT
TOPICAL_OINTMENT | Freq: Two times a day (BID) | CUTANEOUS | Status: DC
  Administered 2014-07-25 – 2014-07-27 (×3): via TOPICAL
  Administered 2014-07-28: 1 via TOPICAL
  Filled 2014-07-25: qty 28.35
  Filled 2014-07-25: qty 0.9

## 2014-07-25 MED ORDER — ACETAMINOPHEN 325 MG PO TABS
650.0000 mg | ORAL_TABLET | Freq: Four times a day (QID) | ORAL | Status: DC | PRN
Start: 2014-07-25 — End: 2014-07-28

## 2014-07-25 MED ORDER — LEVOTHYROXINE SODIUM 150 MCG PO TABS
150.0000 ug | ORAL_TABLET | Freq: Every day | ORAL | Status: DC
  Filled 2014-07-25 (×2): qty 1

## 2014-07-25 MED ORDER — INSULIN DETEMIR 100 UNIT/ML ~~LOC~~ SOLN
10.0000 [IU] | Freq: Every day | SUBCUTANEOUS | Status: DC
  Administered 2014-07-25 – 2014-07-27 (×2): 10 [IU] via SUBCUTANEOUS
  Filled 2014-07-25 (×3): qty 0.1

## 2014-07-25 MED ORDER — SODIUM CHLORIDE 0.9 % IV SOLN
INTRAVENOUS | Status: DC
  Administered 2014-07-25: via INTRAVENOUS
  Administered 2014-07-26: 1000 mL via INTRAVENOUS

## 2014-07-25 MED ORDER — ATORVASTATIN CALCIUM 40 MG PO TABS
40.0000 mg | ORAL_TABLET | Freq: Every day | ORAL | Status: DC
  Filled 2014-07-25: qty 1

## 2014-07-25 MED ORDER — ACETAMINOPHEN 650 MG RE SUPP: 650.0000 mg | Freq: Four times a day (QID) | RECTAL | Status: DC | PRN

## 2014-07-25 MED ORDER — ASPIRIN 81 MG PO CHEW
81.0000 mg | CHEWABLE_TABLET | Freq: Every day | ORAL | Status: DC
  Filled 2014-07-25 (×2): qty 1

## 2014-07-25 NOTE — ED Notes (Signed)
Nurse currently drawing labs 

## 2014-07-25 NOTE — ED Provider Notes (Addendum)
CSN: [id number]     Arrival date & time 07/25/14  1724 History   First MD Initiated Contact with Patient 07/25/14 1733     Chief Complaint  Patient presents with  . Fall  . Hip Pain   HPI Pt was outside doing yard work.  She tripped on the edging of a walkway and she fell backwards.  Pt landed on her buttock.  She is having pain now in her right hip, right elbow and knee.  She did not hit her head or lose consciousness.  She was not able to stand.  EMS was called.  Pt was given pain medications and transported to the ED.  Past Medical History  Diagnosis Date  . Cancer   . Diabetes mellitus   . Stroke    Past Surgical History  Procedure Laterality Date  . Breast surgery     No family history on file. History  Substance Use Topics  . Smoking status: Current Some Day Smoker  . Smokeless tobacco: Never Used  . Alcohol Use: No   OB History    No data available     Review of Systems  All other systems reviewed and are negative.     Allergies  Erythromycin and Fosamax  Home Medications   Prior to Admission medications   Medication Sig Start Date End Date Taking? Authorizing Provider  aspirin 81 MG tablet Take 81 mg by mouth daily.   Yes Historical Provider, MD  atorvastatin (LIPITOR) 40 MG tablet Take 40 mg by mouth daily.     Yes Historical Provider, MD  Calcium Carbonate-Vitamin D (CALTRATE 600+D PO) Take 1 tablet by mouth 2 (two) times daily.   Yes Historical Provider, MD  denosumab (PROLIA) 60 MG/ML SOLN injection Inject 60 mg into the skin every 6 (six) months. Administer in upper arm, thigh, or abdomen   Yes Historical Provider, MD  donepezil (ARICEPT) 10 MG tablet Take 10 mg by mouth at bedtime. 07/13/14  Yes Historical Provider, MD  glipiZIDE (GLUCOTROL) 5 MG tablet Take 10 mg by mouth 2 (two) times daily. 05/14/14  Yes Historical Provider, MD  LEVEMIR FLEXTOUCH 100 UNIT/ML Pen Inject 14 Units as directed at bedtime. 07/01/14  Yes Historical Provider, MD  levothyroxine  (SYNTHROID, LEVOTHROID) 150 MCG tablet Take 150 mcg by mouth daily. 06/24/14  Yes Historical Provider, MD  magnesium gluconate (MAGONATE) 500 MG tablet Take 1,000 mg by mouth daily.    Yes Historical Provider, MD  metFORMIN (GLUCOPHAGE) 1000 MG tablet Take 1,000 mg by mouth 2 (two) times daily. 06/17/14  Yes Historical Provider, MD  Multiple Vitamin (MULITIVITAMIN WITH MINERALS) TABS Take 1 tablet by mouth daily.     Yes Historical Provider, MD   BP 135/72 mmHg  Pulse 91  Temp(Src) 98 F (36.7 C) (Oral)  Resp 18  SpO2 95% Physical Exam  Constitutional: No distress.  HENT:  Head: Normocephalic and atraumatic.  Right Ear: External ear normal.  Left Ear: External ear normal.  Eyes: Conjunctivae are normal. Right eye exhibits no discharge. Left eye exhibits no discharge. No scleral icterus.  Neck: Neck supple. No tracheal deviation present.  Cardiovascular: Normal rate, regular rhythm and intact distal pulses.   Pulmonary/Chest: Effort normal and breath sounds normal. No stridor. No respiratory distress. She has no wheezes. She has no rales.  Abdominal: Soft. Bowel sounds are normal. She exhibits no distension. There is no tenderness. There is no rebound and no guarding.  Musculoskeletal: She exhibits tenderness. She exhibits no edema.  Right elbow: Tenderness found. Olecranon process tenderness noted.       Right hip: She exhibits tenderness, bony tenderness and deformity.       Right knee: She exhibits decreased range of motion and swelling. Tenderness found.       Cervical back: Normal.       Thoracic back: Normal.       Lumbar back: Normal.  Abrasion right elbow  Neurological: She is alert. She has normal strength. No cranial nerve deficit (no facial droop, extraocular movements intact, no slurred speech) or sensory deficit. She exhibits normal muscle tone. She displays no seizure activity. Coordination normal.  Mild aphasia (per patient and friend this is a chronic issue, not new)   Skin: Skin is warm and dry. No rash noted. She is not diaphoretic.  Psychiatric: She has a normal mood and affect.  Nursing note and vitals reviewed.   ED Course  Procedures (including critical care time) Labs Review Labs Reviewed  BASIC METABOLIC PANEL - Abnormal; Notable for the following:    Glucose, Bld 238 (*)    BUN 24 (*)    All other components within normal limits  CBC WITH DIFFERENTIAL/PLATELET - Abnormal; Notable for the following:    Neutrophils Relative % 80 (*)    All other components within normal limits  PROTIME-INR  TYPE AND SCREEN  ABO/RH    Imaging Review Dg Elbow Complete Right  07/25/2014   CLINICAL DATA:  Right elbow pain after falling today while doing yard work.  EXAM: RIGHT ELBOW - COMPLETE 3+ VIEW  COMPARISON:  None.  FINDINGS: Mild posterior soft tissue swelling. No fracture, dislocation or effusion.  IMPRESSION: No fracture.   Electronically Signed   By: Claudie Revering M.D.   On: 07/25/2014 19:19   Dg Knee 1-2 Views Right  07/25/2014   CLINICAL DATA:  80 year old female tripped and fell in her yd. Pain. Initial encounter.  EXAM: RIGHT KNEE - 1-2 VIEW  COMPARISON:  Right femur series from today reported separately.  FINDINGS: Cross-table lateral AP and lateral views of the right knee. Moderate to large joint effusion. The patella appears intact. Medial and lateral joint space loss. Osteopenia. No acute fracture identified about the right knee. Calcified atherosclerosis in the visible right lower extremity.  IMPRESSION: Moderate joint effusion. No acute fracture or dislocation identified about the right knee.   Electronically Signed   By: Genevie Ann M.D.   On: 07/25/2014 19:19   Dg Hip Unilat With Pelvis 2-3 Views Right  07/25/2014   CLINICAL DATA:  Slipped and fell  EXAM: RIGHT HIP (WITH PELVIS) 2-3 VIEWS  COMPARISON:  01/22/2011.  FINDINGS: The patient is rotated. There is a mildly impacted right intertrochanteric proximal femoral fracture. Right femoral head is  properly located. Irregularity of the right inferior pubic ramus is compatible with previous fracture site.  IMPRESSION: Mildly impacted right femoral intertrochanteric fracture.   Electronically Signed   By: Conchita Paris M.D.   On: 07/25/2014 19:22   Dg Femur, Min 2 Views Right  07/25/2014   CLINICAL DATA:  Right hip pain after falling while doing yard work today.  EXAM: RIGHT FEMUR 2 VIEWS  COMPARISON:  None.  FINDINGS: Comminuted right intertrochanteric fracture with varus angulation. There is also anterior displacement of the distal fragment. Atheromatous arterial calcifications. Mild right patellofemoral degenerative spur formation.  IMPRESSION: Right intertrochanteric fracture.   Electronically Signed   By: Claudie Revering M.D.   On: 07/25/2014 19:21    Medications  bacitracin ointment (not administered)  morphine 4 MG/ML injection 4 mg (4 mg Intravenous Given 07/25/14 1853)     MDM   Final diagnoses:  Pain  Intertrochanteric fracture of right hip, closed, initial encounter  Knee effusion, right  Elbow abrasion, right, initial encounter    Patient's x-rays show a right sided intertrochanteric hip fracture. Patient's knee and elbow films are negative for acute fracture.   Will consult with the medical service for admission.  Pt has seen Dr Theda Sers in the past.  Will consult with orthopedics on call for his group.     Dorie Rank, MD 07/25/14 2020  Discussed with Merla Riches while in the OR.  Will see  The patient in the ED.  Dorie Rank, MD 07/25/14 2158

## 2014-07-25 NOTE — H&P (Signed)
[facility] History and Physical  [name] AOZ:[id number] DOB: Jul 03, 1934 DOA: 07/25/2014  Referring physician: ED physician PCP: Kelly Argyle, MD  Specialists:   Chief Complaint: right hip pain after fall  HPI: [name] is a 80 y.o. female with PMH of hyperlipidemia, diabetes mellitus, hypothyroidism, remote history of breast cancer (1996, post status of left lumpectomy, radiation and chemotherapy), stroke, chronic aphasia, who presents with right hip pain after fall.  Patient reports that she had fall when she was outside doing yard work and tripped on the edging of a walkway at about 5:00 PM. She fell backwards and landed on her buttock. She developed pain in her right hip, right elbow and knee.  She did not hit her head or lose consciousness. No sensation change in her legs. She was not able to stand. She reports having intermittent mild diarrhea, but no diarrhea today.  Currently patient denies fever, chills, running nose, ear pain, headaches, cough, chest pain, SOB, abdominal pain, diarrhea, constipation, dysuria, urgency, frequency, hematuria or leg swelling. No unilateral weakness, numbness or tingling sensations. No vision change or hearing loss.    In ED, patient was found to have WBC 9.3, temperature normal, no tachycardia, electrolytes okay. X-ray showed mildly impacted right femoral intertrochanteric fracture. No bony fracture in right elbow and right knee x-ray. Patient is admitted to inpatient for further evaluation and treatment. Orthopedic surgeon was consulted by ED.   Where does patient live?   At home    Can patient participate in ADLs?  Some   Review of Systems:   General: no fevers, chills, no changes in body weight, has fatigue HEENT: no blurry vision, hearing changes or sore throat Pulm: no dyspnea, coughing, wheezing CV: no chest pain, palpitations Abd: no nausea, vomiting, abdominal pain, diarrhea, constipation GU: no dysuria, burning on  urination, increased urinary frequency, hematuria  Ext: has right hip pain Neuro: no unilateral weakness, numbness, or tingling, no vision change or hearing loss Skin: Abrasion to right elbow  MSK: No muscle spasm, no deformity, no limitation of range of movement in spin Heme: No easy bruising.  Travel history: No recent long distant travel.  Allergy:  Allergies  Allergen Reactions  . Erythromycin Hives  . Fosamax [Alendronate Sodium] Other (See Comments)    Reaction Unknown    Past Medical History  Diagnosis Date  . Cancer   . Diabetes mellitus   . Stroke   . Tobacco abuse   . Aphasia     Past Surgical History  Procedure Laterality Date  . Breast surgery      Social History:  reports that she has been smoking.  She has never used smokeless tobacco. She reports that she does not drink alcohol or use illicit drugs.  Family History:  Family History  Problem Relation Age of Onset  . Dementia Mother   . Congestive Heart Failure Father      Prior to Admission medications   Medication Sig Start Date End Date Taking? Authorizing Provider  aspirin 81 MG tablet Take 81 mg by mouth daily.   Yes Historical Provider, MD  atorvastatin (LIPITOR) 40 MG tablet Take 40 mg by mouth daily.     Yes Historical Provider, MD  Calcium Carbonate-Vitamin D (CALTRATE 600+D PO) Take 1 tablet by mouth 2 (two) times daily.   Yes Historical Provider, MD  denosumab (PROLIA) 60 MG/ML SOLN injection Inject 60 mg into the skin every 6 (six) months. Administer in upper arm, thigh, or abdomen   Yes  Historical Provider, MD  donepezil (ARICEPT) 10 MG tablet Take 10 mg by mouth at bedtime. 07/13/14  Yes Historical Provider, MD  glipiZIDE (GLUCOTROL) 5 MG tablet Take 10 mg by mouth 2 (two) times daily. 05/14/14  Yes Historical Provider, MD  LEVEMIR FLEXTOUCH 100 UNIT/ML Pen Inject 14 Units as directed at bedtime. 07/01/14  Yes Historical Provider, MD  levothyroxine (SYNTHROID, LEVOTHROID) 150 MCG tablet Take 150  mcg by mouth daily. 06/24/14  Yes Historical Provider, MD  magnesium gluconate (MAGONATE) 500 MG tablet Take 1,000 mg by mouth daily.    Yes Historical Provider, MD  metFORMIN (GLUCOPHAGE) 1000 MG tablet Take 1,000 mg by mouth 2 (two) times daily. 06/17/14  Yes Historical Provider, MD  Multiple Vitamin (MULITIVITAMIN WITH MINERALS) TABS Take 1 tablet by mouth daily.     Yes Historical Provider, MD    Physical Exam: Filed Vitals:   07/25/14 1737 07/25/14 1950 07/25/14 2226  BP: 136/64 135/72 129/78  Pulse: 82 91 80  Temp: 98 F (36.7 C)  98.2 F (36.8 C)  TempSrc: Oral  Oral  Resp: 17 18 18   SpO2: 96% 95% 96%   General: Not in acute distress HEENT:       Eyes: PERRL, EOMI, no scleral icterus.       ENT: No discharge from the ears and nose, no pharynx injection, no tonsillar enlargement.        Neck: No JVD, no bruit, no mass felt. Heme: No neck lymph node enlargement. Cardiac: S1/S2, RRR, No murmurs, No gallops or rubs. Pulm: No rales, wheezing, rhonchi or rubs. Abd: Soft, nondistended, nontender, no rebound pain, no organomegaly, BS present. Ext: No pitting leg edema bilaterally. 2+DP/PT pulse bilaterally. There is tenderness over right hip, right leg is externally rotated.  Musculoskeletal: No joint deformities, No joint redness or warmth, no limitation of ROM in spin. Skin: No rashes. Abrasion right elbow  Neuro: Alert, oriented X3, cranial nerves II-XII grossly intact except for mild aphasia (per patient and friend, this is a chronic issue, no change) muscle strength 5/5 in all extremities, sensation to light touch intact.  Psych: Patient is not psychotic, no suicidal or hemocidal ideation.      Labs on Admission:  Basic Metabolic Panel:  Recent Labs Lab 07/25/14 1820  NA 141  K 4.1  CL 109  CO2 25  GLUCOSE 238*  BUN 24*  CREATININE 0.84  CALCIUM 9.6   Liver Function Tests: No results for input(s): AST, ALT, ALKPHOS, BILITOT, PROT, ALBUMIN in the last 168  hours. No results for input(s): LIPASE, AMYLASE in the last 168 hours. No results for input(s): AMMONIA in the last 168 hours. CBC:  Recent Labs Lab 07/25/14 1820  WBC 9.3  NEUTROABS 7.4  HGB 13.9  HCT 41.2  MCV 92.2  PLT 163   Cardiac Enzymes: No results for input(s): CKTOTAL, CKMB, CKMBINDEX, TROPONINI in the last 168 hours.  BNP (last 3 results) No results for input(s): BNP in the last 8760 hours.  ProBNP (last 3 results) No results for input(s): PROBNP in the last 8760 hours.  CBG: No results for input(s): GLUCAP in the last 168 hours.  Radiological Exams on Admission: Dg Elbow Complete Right  07/25/2014   CLINICAL DATA:  Right elbow pain after falling today while doing yard work.  EXAM: RIGHT ELBOW - COMPLETE 3+ VIEW  COMPARISON:  None.  FINDINGS: Mild posterior soft tissue swelling. No fracture, dislocation or effusion.  IMPRESSION: No fracture.   Electronically Signed  By: Claudie Revering M.D.   On: 07/25/2014 19:19   Dg Knee 1-2 Views Right  07/25/2014   CLINICAL DATA:  80 year old female tripped and fell in her yd. Pain. Initial encounter.  EXAM: RIGHT KNEE - 1-2 VIEW  COMPARISON:  Right femur series from today reported separately.  FINDINGS: Cross-table lateral AP and lateral views of the right knee. Moderate to large joint effusion. The patella appears intact. Medial and lateral joint space loss. Osteopenia. No acute fracture identified about the right knee. Calcified atherosclerosis in the visible right lower extremity.  IMPRESSION: Moderate joint effusion. No acute fracture or dislocation identified about the right knee.   Electronically Signed   By: Genevie Ann M.D.   On: 07/25/2014 19:19   Dg Hip Unilat With Pelvis 2-3 Views Right  07/25/2014   CLINICAL DATA:  Slipped and fell  EXAM: RIGHT HIP (WITH PELVIS) 2-3 VIEWS  COMPARISON:  01/22/2011.  FINDINGS: The patient is rotated. There is a mildly impacted right intertrochanteric proximal femoral fracture. Right femoral head  is properly located. Irregularity of the right inferior pubic ramus is compatible with previous fracture site.  IMPRESSION: Mildly impacted right femoral intertrochanteric fracture.   Electronically Signed   By: Conchita Paris M.D.   On: 07/25/2014 19:22   Dg Femur, Min 2 Views Right  07/25/2014   CLINICAL DATA:  Right hip pain after falling while doing yard work today.  EXAM: RIGHT FEMUR 2 VIEWS  COMPARISON:  None.  FINDINGS: Comminuted right intertrochanteric fracture with varus angulation. There is also anterior displacement of the distal fragment. Atheromatous arterial calcifications. Mild right patellofemoral degenerative spur formation.  IMPRESSION: Right intertrochanteric fracture.   Electronically Signed   By: Claudie Revering M.D.   On: 07/25/2014 19:21    EKG: Not done in ED, will get one.   Assessment/Plan Principal Problem:   Hip fracture, right Active Problems:   Hypothyroidism   Diabetes mellitus without complication   Essential hypertension   Osteoporosis   Hx Breast cancer, IDC, Stage II, right, receptor +, her 2 neg.   Tobacco abuse   Aphasia   Hip fracture  Hip fracture, right: As evidenced by x-ray. Patient has moderate pain now. No neurovascular compromise. Orthopedic surgeon was consulted.  - will admit to Med-Surg bed - Pain control: morphine prn and percocet - follow up ortho recs - NPO - type and cross - INR/PTT  -Hypothyroidism: no TSH on record -Continue home Synthroid -Check TSH  DM-II: noTSH on record. Patient is taking Levemir, glipizide and metformin at home -will decrease Levemir dose from 14-10 units qhs -SSI -Check A1c  HLD: no LDL on record -Continue home medications: Lipitor -Check FLP  Tobacco abuse: -Did counseling about importance of quitting smoking -Nicotine patch  DVT ppx: SCD  Code Status: DNR Family Communication:  Yes, patient's Health power of attorney, Ms. Hogarth at bed side Disposition Plan: Admit to inpatient   Date  of Service 07/25/2014    Ivor Costa [facility] Pager [phone]  If 7PM-7AM, please contact night-coverage [email or web address] Password [facility] 07/25/2014, 10:27 PM

## 2014-07-25 NOTE — ED Notes (Signed)
Per EMS, pt from home, was doing yard work today, was backing up when she tripped on edgers in her yard and fell backwards.  Landing on her buttocks.  Abrasions noted on her R elbow and R knee.  Pt reports R hip pain, shortening and external rotation noted.  Pt received 100mg  fentanyl IVP en route.  Pelvic immobilization in place.  Pt is A&Ox 4.

## 2014-07-25 NOTE — Consult Note (Signed)
[name] is an 80 y.o. female.    Chief Complaint: right hip pain s/p fall  HPI: 80 y/o female missed a step and fell at home earlier today. C/o pain to right hip/groin, right knee and right elbow. Denies any syncope or LOC. Unable to bear any weight to right lower extremity after the fall. Has hx of bilateral pelvis fractures in the past. Otherwise doing fair. Pain is moderate currently.  PCP:  Mathews Argyle, MD  PMH: Past Medical History  Diagnosis Date  . Cancer   . Diabetes mellitus   . Stroke   . Tobacco abuse   . Aphasia     PSH: Past Surgical History  Procedure Laterality Date  . Breast surgery      Social History:  reports that she has been smoking.  She has never used smokeless tobacco. She reports that she does not drink alcohol or use illicit drugs.  Allergies:  Allergies  Allergen Reactions  . Erythromycin Hives  . Fosamax [Alendronate Sodium] Other (See Comments)    Reaction Unknown    Medications: Current Facility-Administered Medications  Medication Dose Route Frequency Provider Last Rate Last Dose  . 0.9 %  sodium chloride infusion   Intravenous Continuous Ivor Costa, MD      . acetaminophen (TYLENOL) tablet 650 mg  650 mg Oral Q6H PRN Ivor Costa, MD       Or  . acetaminophen (TYLENOL) suppository 650 mg  650 mg Rectal Q6H PRN Ivor Costa, MD      . alum & mag hydroxide-simeth (MAALOX/MYLANTA) 200-200-20 MG/5ML suspension 30 mL  30 mL Oral Q6H PRN Ivor Costa, MD      . aspirin chewable tablet 81 mg  81 mg Oral Daily Ivor Costa, MD      . Derrill Memo ON 07/26/2014] atorvastatin (LIPITOR) tablet 40 mg  40 mg Oral Daily Ivor Costa, MD      . bacitracin ointment   Topical BID Dorie Rank, MD      . Derrill Memo ON 07/26/2014] calcium-vitamin D (OSCAL WITH D) 500-200 MG-UNIT per tablet 1 tablet  1 tablet Oral BID WC Ivor Costa, MD      . donepezil (ARICEPT) tablet 10 mg  10 mg Oral QHS Ivor Costa, MD      . Derrill Memo ON 07/26/2014] insulin aspart (novoLOG) injection 0-9  Units  0-9 Units Subcutaneous TID WC Ivor Costa, MD      . insulin detemir (LEVEMIR) injection 10 Units  10 Units Subcutaneous QHS Ivor Costa, MD      . Derrill Memo ON 07/26/2014] levothyroxine (SYNTHROID, LEVOTHROID) tablet 150 mcg  150 mcg Oral QAC breakfast Ivor Costa, MD      . Derrill Memo ON 07/26/2014] magnesium gluconate (MAGONATE) tablet 1,000 mg  1,000 mg Oral Daily Ivor Costa, MD      . morphine 2 MG/ML injection 2 mg  2 mg Intravenous Q3H PRN Ivor Costa, MD   2 mg at 07/25/14 2223  . multivitamin with minerals tablet 1 tablet  1 tablet Oral Daily Ivor Costa, MD      . nicotine (NICODERM CQ - dosed in mg/24 hours) patch 21 mg  21 mg Transdermal Daily Ivor Costa, MD      . ondansetron [facility]) tablet 4 mg  4 mg Oral Q6H PRN Ivor Costa, MD       Or  . ondansetron [facility]) injection 4 mg  4 mg Intravenous Q6H PRN Ivor Costa, MD      . oxyCODONE-acetaminophen (PERCOCET/ROXICET) 5-325 MG  per tablet 1 tablet  1 tablet Oral Q4H PRN Ivor Costa, MD        Results for orders placed or performed during the hospital encounter of 07/25/14 (from the past 48 hour(s))  Basic metabolic panel     Status: Abnormal   Collection Time: 07/25/14  6:20 PM  Result Value Ref Range   Sodium 141 135 - 145 mmol/L   Potassium 4.1 3.5 - 5.1 mmol/L   Chloride 109 101 - 111 mmol/L   CO2 25 22 - 32 mmol/L   Glucose, Bld 238 (H) 65 - 99 mg/dL   BUN 24 (H) 6 - 20 mg/dL   Creatinine, Ser 0.84 0.44 - 1.00 mg/dL   Calcium 9.6 8.9 - 10.3 mg/dL   GFR calc non Af Amer >60 >60 mL/min   GFR calc Af Amer >60 >60 mL/min    Comment: (NOTE) The eGFR has been calculated using the CKD EPI equation. This calculation has not been validated in all clinical situations. eGFR's persistently <60 mL/min signify possible Chronic Kidney Disease.    Anion gap 7 5 - 15  CBC WITH DIFFERENTIAL     Status: Abnormal   Collection Time: 07/25/14  6:20 PM  Result Value Ref Range   WBC 9.3 4.0 - 10.5 K/uL   RBC 4.47 3.87 - 5.11 MIL/uL   Hemoglobin 13.9 12.0  - 15.0 g/dL   HCT 41.2 36.0 - 46.0 %   MCV 92.2 78.0 - 100.0 fL   MCH 31.1 26.0 - 34.0 pg   MCHC 33.7 30.0 - 36.0 g/dL   RDW 12.7 11.5 - 15.5 %   Platelets 163 150 - 400 K/uL   Neutrophils Relative % 80 (H) 43 - 77 %   Neutro Abs 7.4 1.7 - 7.7 K/uL   Lymphocytes Relative 13 12 - 46 %   Lymphs Abs 1.2 0.7 - 4.0 K/uL   Monocytes Relative 7 3 - 12 %   Monocytes Absolute 0.7 0.1 - 1.0 K/uL   Eosinophils Relative 0 0 - 5 %   Eosinophils Absolute 0.0 0.0 - 0.7 K/uL   Basophils Relative 0 0 - 1 %   Basophils Absolute 0.0 0.0 - 0.1 K/uL  Protime-INR     Status: None   Collection Time: 07/25/14  6:20 PM  Result Value Ref Range   Prothrombin Time 13.2 11.6 - 15.2 seconds   INR 0.98 0.00 - 1.49  Type and screen     Status: None   Collection Time: 07/25/14  6:20 PM  Result Value Ref Range   ABO/RH(D) A POS    Antibody Screen NEG    Sample Expiration 07/28/2014   ABO/Rh     Status: None   Collection Time: 07/25/14  6:20 PM  Result Value Ref Range   ABO/RH(D) A POS    Dg Elbow Complete Right  07/25/2014   CLINICAL DATA:  Right elbow pain after falling today while doing yard work.  EXAM: RIGHT ELBOW - COMPLETE 3+ VIEW  COMPARISON:  None.  FINDINGS: Mild posterior soft tissue swelling. No fracture, dislocation or effusion.  IMPRESSION: No fracture.   Electronically Signed   By: Claudie Revering M.D.   On: 07/25/2014 19:19   Dg Knee 1-2 Views Right  07/25/2014   CLINICAL DATA:  80 year old female tripped and fell in her yd. Pain. Initial encounter.  EXAM: RIGHT KNEE - 1-2 VIEW  COMPARISON:  Right femur series from today reported separately.  FINDINGS: Cross-table lateral AP and lateral views  of the right knee. Moderate to large joint effusion. The patella appears intact. Medial and lateral joint space loss. Osteopenia. No acute fracture identified about the right knee. Calcified atherosclerosis in the visible right lower extremity.  IMPRESSION: Moderate joint effusion. No acute fracture or  dislocation identified about the right knee.   Electronically Signed   By: Genevie Ann M.D.   On: 07/25/2014 19:19   Dg Hip Unilat With Pelvis 2-3 Views Right  07/25/2014   CLINICAL DATA:  Slipped and fell  EXAM: RIGHT HIP (WITH PELVIS) 2-3 VIEWS  COMPARISON:  01/22/2011.  FINDINGS: The patient is rotated. There is a mildly impacted right intertrochanteric proximal femoral fracture. Right femoral head is properly located. Irregularity of the right inferior pubic ramus is compatible with previous fracture site.  IMPRESSION: Mildly impacted right femoral intertrochanteric fracture.   Electronically Signed   By: Conchita Paris M.D.   On: 07/25/2014 19:22   Dg Femur, Min 2 Views Right  07/25/2014   CLINICAL DATA:  Right hip pain after falling while doing yard work today.  EXAM: RIGHT FEMUR 2 VIEWS  COMPARISON:  None.  FINDINGS: Comminuted right intertrochanteric fracture with varus angulation. There is also anterior displacement of the distal fragment. Atheromatous arterial calcifications. Mild right patellofemoral degenerative spur formation.  IMPRESSION: Right intertrochanteric fracture.   Electronically Signed   By: Claudie Revering M.D.   On: 07/25/2014 19:21    ROS: ROS Hx of aphasia Unable to bear weight right lower extremity after her fall  Physical Exam: Alert and oriented 80 y/o female in no acute distress Mild aphasia with speaking but otherwise normal Right upper extremity with full rom and small abrasion to right posterior elbow currently dressed Left upper extremity with full rom and no deformity Left lower extremity with full rom and no deformity Right lower extremity with external rotation and shortening Small abrasion to anterior knee nv intact distally Small chronic rash to medial right leg No edema distally Physical Exam   Assessment/Plan Assessment: right femur fracture intertroch  Plan: Medical team admitted pt and plan for surgical management tomorrow depending on OR  scheduled NPO after midnight Pain management Will try bucks traction to aid with relief tonight

## 2014-07-25 NOTE — ED Notes (Signed)
Bed: WA03 Expected date:  Expected time:  Means of arrival:  Comments: EMS/36F/fall

## 2014-07-26 ENCOUNTER — Inpatient Hospital Stay (HOSPITAL_COMMUNITY): Payer: Medicare Other | Admitting: Anesthesiology

## 2014-07-26 ENCOUNTER — Inpatient Hospital Stay (HOSPITAL_COMMUNITY): Payer: Medicare Other

## 2014-07-26 ENCOUNTER — Encounter (HOSPITAL_COMMUNITY)
Admission: EM | Disposition: A | Discharge status: Skilled Nursing Facility | Payer: Self-pay | Source: Home / Self Care | Attending: Internal Medicine

## 2014-07-26 DIAGNOSIS — E119 Type 2 diabetes mellitus without complications: Secondary | ICD-10-CM

## 2014-07-26 DIAGNOSIS — S72141A Displaced intertrochanteric fracture of right femur, initial encounter for closed fracture: Principal | ICD-10-CM

## 2014-07-26 DIAGNOSIS — M25469 Effusion, unspecified knee: Secondary | ICD-10-CM | POA: Diagnosis present

## 2014-07-26 DIAGNOSIS — I10 Essential (primary) hypertension: Secondary | ICD-10-CM

## 2014-07-26 DIAGNOSIS — E039 Hypothyroidism, unspecified: Secondary | ICD-10-CM

## 2014-07-26 DIAGNOSIS — M25461 Effusion, right knee: Secondary | ICD-10-CM

## 2014-07-26 DIAGNOSIS — S72001A Fracture of unspecified part of neck of right femur, initial encounter for closed fracture: Secondary | ICD-10-CM

## 2014-07-26 DIAGNOSIS — S72009A Fracture of unspecified part of neck of unspecified femur, initial encounter for closed fracture: Secondary | ICD-10-CM | POA: Diagnosis present

## 2014-07-26 HISTORY — PX: INTRAMEDULLARY (IM) NAIL INTERTROCHANTERIC: SHX5875

## 2014-07-26 LAB — APTT: aPTT: 24 seconds (ref 24–37)

## 2014-07-26 LAB — COMPREHENSIVE METABOLIC PANEL
ALK PHOS: 48 U/L (ref 38–126)
ALT: 24 U/L (ref 14–54)
AST: 19 U/L (ref 15–41)
Albumin: 3.8 g/dL (ref 3.5–5.0)
Anion gap: 9 (ref 5–15)
BILIRUBIN TOTAL: 0.9 mg/dL (ref 0.3–1.2)
BUN: 22 mg/dL — AB (ref 6–20)
CO2: 25 mmol/L (ref 22–32)
Calcium: 9 mg/dL (ref 8.9–10.3)
Chloride: 106 mmol/L (ref 101–111)
Creatinine, Ser: 0.6 mg/dL (ref 0.44–1.00)
GFR calc Af Amer: >60 mL/min (ref >60)
GLUCOSE: 242 mg/dL — AB (ref 65–99)
Potassium: 3.7 mmol/L (ref 3.5–5.1)
SODIUM: 140 mmol/L (ref 135–145)
Total Protein: 6.2 g/dL — ABNORMAL LOW (ref 6.5–8.1)

## 2014-07-26 LAB — GLUCOSE, CAPILLARY
GLUCOSE-CAPILLARY: 163 mg/dL — AB (ref 65–99)
GLUCOSE-CAPILLARY: 266 mg/dL — AB (ref 65–99)
Glucose-Capillary: 204 mg/dL — ABNORMAL HIGH (ref 65–99)
Glucose-Capillary: 158 mg/dL — ABNORMAL HIGH (ref 65–99)
Glucose-Capillary: 208 mg/dL — ABNORMAL HIGH (ref 65–99)

## 2014-07-26 LAB — CBC
HEMATOCRIT: 37.2 % (ref 36.0–46.0)
Hemoglobin: 12.5 g/dL (ref 12.0–15.0)
MCH: 31.4 pg (ref 26.0–34.0)
MCHC: 33.6 g/dL (ref 30.0–36.0)
MCV: 93.5 fL (ref 78.0–100.0)
PLATELETS: 167 10*3/uL (ref 150–400)
RBC: 3.98 MIL/uL (ref 3.87–5.11)
RDW: 13 % (ref 11.5–15.5)
WBC: 8.1 10*3/uL (ref 4.0–10.5)

## 2014-07-26 LAB — LIPID PANEL
Cholesterol: 131 mg/dL (ref 0–200)
HDL: 42 mg/dL (ref >40)
LDL Cholesterol: 71 mg/dL (ref 0–99)
Total CHOL/HDL Ratio: 3.1 RATIO
Triglycerides: 92 mg/dL (ref <150)
VLDL: 18 mg/dL (ref 0–40)

## 2014-07-26 LAB — MRSA PCR SCREENING: MRSA by PCR: NEGATIVE

## 2014-07-26 LAB — TSH: TSH: 0.189 u[IU]/mL — AB (ref 0.350–4.500)

## 2014-07-26 SURGERY — FIXATION, FRACTURE, INTERTROCHANTERIC, WITH INTRAMEDULLARY ROD
Anesthesia: General | Site: Hip | Laterality: Right

## 2014-07-26 MED ORDER — CLINDAMYCIN PHOSPHATE 600 MG/50ML IV SOLN
600.0000 mg | Freq: Once | INTRAVENOUS | Status: AC
  Administered 2014-07-26: 600 mg via INTRAVENOUS
  Filled 2014-07-26: qty 50

## 2014-07-26 MED ORDER — ACETAMINOPHEN 650 MG RE SUPP: 650.0000 mg | Freq: Four times a day (QID) | RECTAL | Status: DC | PRN

## 2014-07-26 MED ORDER — SODIUM CHLORIDE 0.9 % IR SOLN
Status: DC | PRN
  Administered 2014-07-26: 500 mL

## 2014-07-26 MED ORDER — MORPHINE SULFATE 2 MG/ML IJ SOLN: 0.5000 mg | INTRAMUSCULAR | Status: DC | PRN

## 2014-07-26 MED ORDER — ONDANSETRON HCL 4 MG/2ML IJ SOLN: 4.0000 mg | Freq: Four times a day (QID) | INTRAMUSCULAR | Status: DC | PRN

## 2014-07-26 MED ORDER — SENNOSIDES-DOCUSATE SODIUM 8.6-50 MG PO TABS: 1.0000 | ORAL_TABLET | Freq: Every evening | ORAL | Status: DC | PRN

## 2014-07-26 MED ORDER — METOCLOPRAMIDE HCL 10 MG PO TABS: 5.0000 mg | ORAL_TABLET | Freq: Three times a day (TID) | ORAL | Status: DC | PRN

## 2014-07-26 MED ORDER — PHENOL 1.4 % MT LIQD: 1.0000 | OROMUCOSAL | Status: DC | PRN

## 2014-07-26 MED ORDER — SODIUM CHLORIDE 0.9 % IR SOLN
Status: AC
  Filled 2014-07-26: qty 1

## 2014-07-26 MED ORDER — LACTATED RINGERS IV SOLN: INTRAVENOUS | Status: DC

## 2014-07-26 MED ORDER — GLYCOPYRROLATE 0.2 MG/ML IJ SOLN
INTRAMUSCULAR | Status: AC
  Filled 2014-07-26: qty 4

## 2014-07-26 MED ORDER — MENTHOL 3 MG MT LOZG: 1.0000 | LOZENGE | OROMUCOSAL | Status: DC | PRN

## 2014-07-26 MED ORDER — LEVOTHYROXINE SODIUM 25 MCG PO TABS
137.5000 ug | ORAL_TABLET | Freq: Every day | ORAL | Status: DC
  Administered 2014-07-27 – 2014-07-28 (×2): 137.5 ug via ORAL
  Filled 2014-07-26 (×4): qty 0.5

## 2014-07-26 MED ORDER — MAGNESIUM CITRATE PO SOLN: 1.0000 | Freq: Once | ORAL | Status: AC | PRN

## 2014-07-26 MED ORDER — CEFAZOLIN SODIUM-DEXTROSE 2-3 GM-% IV SOLR
2.0000 g | Freq: Four times a day (QID) | INTRAVENOUS | Status: AC
  Administered 2014-07-26 – 2014-07-27 (×3): 2 g via INTRAVENOUS
  Filled 2014-07-26 (×3): qty 50

## 2014-07-26 MED ORDER — ONDANSETRON HCL 4 MG PO TABS: 4.0000 mg | ORAL_TABLET | Freq: Four times a day (QID) | ORAL | Status: DC | PRN

## 2014-07-26 MED ORDER — RISAQUAD PO CAPS: 1.0000 | ORAL_CAPSULE | Freq: Every day | ORAL | Status: DC

## 2014-07-26 MED ORDER — PROPOFOL 10 MG/ML IV BOLUS
INTRAVENOUS | Status: AC
  Filled 2014-07-26: qty 20

## 2014-07-26 MED ORDER — LACTATED RINGERS IV SOLN
INTRAVENOUS | Status: DC | PRN
  Administered 2014-07-26: 09:00:00 via INTRAVENOUS

## 2014-07-26 MED ORDER — ACETAMINOPHEN 500 MG PO TABS
500.0000 mg | ORAL_TABLET | Freq: Three times a day (TID) | ORAL | Status: DC
  Administered 2014-07-26 – 2014-07-27 (×3): 500 mg via ORAL
  Filled 2014-07-26 (×5): qty 1

## 2014-07-26 MED ORDER — FENTANYL CITRATE (PF) 100 MCG/2ML IJ SOLN
25.0000 ug | INTRAMUSCULAR | Status: DC | PRN
  Administered 2014-07-26 (×2): 25 ug via INTRAVENOUS
  Administered 2014-07-26: 50 ug via INTRAVENOUS

## 2014-07-26 MED ORDER — ROCURONIUM BROMIDE 100 MG/10ML IV SOLN
INTRAVENOUS | Status: DC | PRN
  Administered 2014-07-26: 30 mg via INTRAVENOUS
  Administered 2014-07-26: 10 mg via INTRAVENOUS

## 2014-07-26 MED ORDER — ACETAMINOPHEN 325 MG PO TABS: 650.0000 mg | ORAL_TABLET | Freq: Four times a day (QID) | ORAL | Status: DC | PRN

## 2014-07-26 MED ORDER — DOCUSATE SODIUM 100 MG PO CAPS: 100.0000 mg | ORAL_CAPSULE | Freq: Two times a day (BID) | ORAL | Status: DC | PRN

## 2014-07-26 MED ORDER — BISACODYL 5 MG PO TBEC: 5.0000 mg | DELAYED_RELEASE_TABLET | Freq: Every day | ORAL | Status: DC | PRN

## 2014-07-26 MED ORDER — LIDOCAINE HCL (CARDIAC) 20 MG/ML IV SOLN
INTRAVENOUS | Status: AC
  Filled 2014-07-26: qty 5

## 2014-07-26 MED ORDER — DROPERIDOL 2.5 MG/ML IJ SOLN
0.6250 mg | INTRAMUSCULAR | Status: DC | PRN
  Filled 2014-07-26: qty 0.25

## 2014-07-26 MED ORDER — FENTANYL CITRATE (PF) 100 MCG/2ML IJ SOLN
INTRAMUSCULAR | Status: AC
  Filled 2014-07-26: qty 2

## 2014-07-26 MED ORDER — CEFAZOLIN SODIUM-DEXTROSE 2-3 GM-% IV SOLR
2.0000 g | Freq: Once | INTRAVENOUS | Status: AC
  Administered 2014-07-26: 2 g via INTRAVENOUS

## 2014-07-26 MED ORDER — LIDOCAINE HCL (CARDIAC) 20 MG/ML IV SOLN
INTRAVENOUS | Status: DC | PRN
  Administered 2014-07-26: 50 mg via INTRAVENOUS

## 2014-07-26 MED ORDER — ASPIRIN EC 325 MG PO TBEC: 325.0000 mg | DELAYED_RELEASE_TABLET | Freq: Two times a day (BID) | ORAL | Status: AC

## 2014-07-26 MED ORDER — NEOSTIGMINE METHYLSULFATE 10 MG/10ML IV SOLN
INTRAVENOUS | Status: DC | PRN
  Administered 2014-07-26: 3 mg via INTRAVENOUS

## 2014-07-26 MED ORDER — GLYCOPYRROLATE 0.2 MG/ML IJ SOLN
INTRAMUSCULAR | Status: DC | PRN
  Administered 2014-07-26: .6 mg via INTRAVENOUS

## 2014-07-26 MED ORDER — METOCLOPRAMIDE HCL 5 MG/ML IJ SOLN: 5.0000 mg | Freq: Three times a day (TID) | INTRAMUSCULAR | Status: DC | PRN

## 2014-07-26 MED ORDER — RISAQUAD PO CAPS
1.0000 | ORAL_CAPSULE | Freq: Every day | ORAL | Status: DC
  Administered 2014-07-27 – 2014-07-28 (×2): 1 via ORAL
  Filled 2014-07-26 (×3): qty 1

## 2014-07-26 MED ORDER — DEXTROSE 5 % IV SOLN
10.0000 mg | INTRAVENOUS | Status: DC | PRN
  Administered 2014-07-26: 40 ug/min via INTRAVENOUS

## 2014-07-26 MED ORDER — ONDANSETRON HCL 4 MG/2ML IJ SOLN
INTRAMUSCULAR | Status: AC
  Filled 2014-07-26: qty 2

## 2014-07-26 MED ORDER — FENTANYL CITRATE (PF) 100 MCG/2ML IJ SOLN
INTRAMUSCULAR | Status: DC | PRN
  Administered 2014-07-26 (×2): 50 ug via INTRAVENOUS

## 2014-07-26 MED ORDER — PHENYLEPHRINE HCL 10 MG/ML IJ SOLN
INTRAMUSCULAR | Status: DC | PRN
  Administered 2014-07-26: 40 ug via INTRAVENOUS

## 2014-07-26 MED ORDER — PROPOFOL 10 MG/ML IV BOLUS
INTRAVENOUS | Status: DC | PRN
  Administered 2014-07-26: 150 mg via INTRAVENOUS

## 2014-07-26 MED ORDER — METHOCARBAMOL 1000 MG/10ML IJ SOLN
500.0000 mg | Freq: Four times a day (QID) | INTRAVENOUS | Status: DC | PRN
  Administered 2014-07-26: 500 mg via INTRAVENOUS
  Filled 2014-07-26 (×2): qty 5

## 2014-07-26 MED ORDER — HYDROCODONE-ACETAMINOPHEN 5-325 MG PO TABS
1.0000 | ORAL_TABLET | Freq: Four times a day (QID) | ORAL | Status: DC | PRN
  Administered 2014-07-26 – 2014-07-27 (×3): 1 via ORAL
  Administered 2014-07-27: 2 via ORAL
  Administered 2014-07-27 – 2014-07-28 (×2): 1 via ORAL
  Filled 2014-07-26 (×4): qty 1
  Filled 2014-07-26: qty 2
  Filled 2014-07-26 (×2): qty 1

## 2014-07-26 MED ORDER — ROCURONIUM BROMIDE 100 MG/10ML IV SOLN
INTRAVENOUS | Status: AC
  Filled 2014-07-26: qty 1

## 2014-07-26 MED ORDER — METHOCARBAMOL 500 MG PO TABS
500.0000 mg | ORAL_TABLET | Freq: Four times a day (QID) | ORAL | Status: DC | PRN
  Administered 2014-07-26 – 2014-07-27 (×4): 500 mg via ORAL
  Filled 2014-07-26 (×4): qty 1

## 2014-07-26 MED ORDER — HYDROCODONE-ACETAMINOPHEN 5-325 MG PO TABS: 1.0000 | ORAL_TABLET | ORAL | Status: DC | PRN

## 2014-07-26 MED ORDER — ASPIRIN EC 325 MG PO TBEC
325.0000 mg | DELAYED_RELEASE_TABLET | Freq: Two times a day (BID) | ORAL | Status: DC
  Administered 2014-07-26 – 2014-07-28 (×4): 325 mg via ORAL
  Filled 2014-07-26 (×6): qty 1

## 2014-07-26 MED ORDER — DOCUSATE SODIUM 100 MG PO CAPS
100.0000 mg | ORAL_CAPSULE | Freq: Two times a day (BID) | ORAL | Status: DC
  Administered 2014-07-26 – 2014-07-28 (×4): 100 mg via ORAL
  Filled 2014-07-26 (×6): qty 1

## 2014-07-26 MED ORDER — CEFAZOLIN SODIUM-DEXTROSE 2-3 GM-% IV SOLR
INTRAVENOUS | Status: AC
  Filled 2014-07-26: qty 50

## 2014-07-26 SURGICAL SUPPLY — 47 items
BIT DRILL CANN LG 4.3MM (BIT) ×1 IMPLANT
BLADE SURG 15 STRL LF DISP TIS (BLADE) ×1 IMPLANT
BLADE SURG 15 STRL SS (BLADE) ×2
BNDG GAUZE ELAST 4 BULKY (GAUZE/BANDAGES/DRESSINGS) ×3 IMPLANT
COVER PERINEAL POST (MISCELLANEOUS) ×3 IMPLANT
COVER SURGICAL LIGHT HANDLE (MISCELLANEOUS) ×3 IMPLANT
DRAPE PROXIMA HALF (DRAPES) IMPLANT
DRAPE SHEET LG 3/4 BI-LAMINATE (DRAPES) ×3 IMPLANT
DRAPE STERI IOBAN 125X83 (DRAPES) ×3 IMPLANT
DRILL BIT CANN LG 4.3MM (BIT) ×3
DRSG AQUACEL AG ADV 3.5X 4 (GAUZE/BANDAGES/DRESSINGS) ×3 IMPLANT
DRSG AQUACEL AG ADV 3.5X 6 (GAUZE/BANDAGES/DRESSINGS) ×3 IMPLANT
DRSG MEPILEX BORDER 4X4 (GAUZE/BANDAGES/DRESSINGS) IMPLANT
DRSG MEPILEX BORDER 4X8 (GAUZE/BANDAGES/DRESSINGS) IMPLANT
DRSG PAD ABDOMINAL 8X10 ST (GAUZE/BANDAGES/DRESSINGS) ×3 IMPLANT
DURAPREP 26ML APPLICATOR (WOUND CARE) ×3 IMPLANT
ELECT REM PT RETURN 9FT ADLT (ELECTROSURGICAL) ×3
ELECTRODE REM PT RTRN 9FT ADLT (ELECTROSURGICAL) ×1 IMPLANT
FACESHIELD WRAPAROUND (MASK) ×3 IMPLANT
GAUZE XEROFORM 5X9 LF (GAUZE/BANDAGES/DRESSINGS) IMPLANT
GLOVE BIOGEL PI IND STRL 8 (GLOVE) ×2 IMPLANT
GLOVE BIOGEL PI INDICATOR 8 (GLOVE) ×4
GLOVE ECLIPSE 8.0 STRL XLNG CF (GLOVE) ×3 IMPLANT
GLOVE ORTHO TXT STRL SZ7.5 (GLOVE) ×3 IMPLANT
GOWN STRL REUS W/ TWL XL LVL3 (GOWN DISPOSABLE) IMPLANT
GOWN STRL REUS W/TWL LRG LVL3 (GOWN DISPOSABLE) ×3 IMPLANT
GOWN STRL REUS W/TWL XL LVL3 (GOWN DISPOSABLE)
GUIDEPIN 3.2X17.5 THRD DISP (PIN) ×6 IMPLANT
KIT BASIN OR (CUSTOM PROCEDURE TRAY) ×3 IMPLANT
KIT ROOM TURNOVER OR (KITS) ×3 IMPLANT
MANIFOLD NEPTUNE II (INSTRUMENTS) ×3 IMPLANT
NAIL HIP FRACT 130D 11X180 (Screw) ×3 IMPLANT
NS IRRIG 1000ML POUR BTL (IV SOLUTION) IMPLANT
PACK GENERAL/GYN (CUSTOM PROCEDURE TRAY) ×3 IMPLANT
PAD ARMBOARD 7.5X6 YLW CONV (MISCELLANEOUS) ×3 IMPLANT
PAD CAST 4YDX4 CTTN HI CHSV (CAST SUPPLIES) ×1 IMPLANT
PADDING CAST COTTON 4X4 STRL (CAST SUPPLIES) ×2
SCREW BONE CORTICAL 5.0X3 (Screw) ×3 IMPLANT
SCREW LAG HIP NAIL 10.5X95 (Screw) ×3 IMPLANT
STAPLER VISISTAT 35W (STAPLE) IMPLANT
SUT VIC AB 0 CT1 27 (SUTURE) ×4
SUT VIC AB 0 CT1 27XBRD ANBCTR (SUTURE) ×2 IMPLANT
SUT VIC AB 2-0 CT1 27 (SUTURE) ×4
SUT VIC AB 2-0 CT1 TAPERPNT 27 (SUTURE) ×2 IMPLANT
TOWEL OR 17X24 6PK STRL BLUE (TOWEL DISPOSABLE) ×3 IMPLANT
TOWEL OR 17X26 10 PK STRL BLUE (TOWEL DISPOSABLE) ×3 IMPLANT
WATER STERILE IRR 1500ML POUR (IV SOLUTION) ×3 IMPLANT

## 2014-07-26 NOTE — Brief Op Note (Signed)
07/25/2014 - 07/26/2014  10:19 AM  PATIENT:  Melony Overly  80 y.o. female  PRE-OPERATIVE DIAGNOSIS:  Right intertrochanteric femoral fracture  POST-OPERATIVE DIAGNOSIS:  Right intertrochanteric femoral fracture  PROCEDURE:  Procedure(s): INTRAMEDULLARY (IM) NAIL INTERTROCHANTRIC (Right)  SURGEON:  Surgeon(s) and Role:    * Susa Day, MD - Primary  PHYSICIAN ASSISTANT:   ASSISTANTS: None   ANESTHESIA:   general  EBL:  Total I/O In: -  Out: 150 [Urine:150]  BLOOD ADMINISTERED:none  DRAINS: none   LOCAL MEDICATIONS USED:  MARCAINE     SPECIMEN:  No Specimen  DISPOSITION OF SPECIMEN:  N/A  COUNTS:  YES  TOURNIQUET:  * No tourniquets in log *  DICTATION: .Other Dictation: Dictation Number [id number]  PLAN OF CARE: Admit to inpatient   PATIENT DISPOSITION:  PACU - hemodynamically stable.   Delay start of Pharmacological VTE agent (>24hrs) due to surgical blood loss or risk of bleeding: no

## 2014-07-26 NOTE — Op Note (Signed)
NAMELENDY, [name] NO.:  [id number]  MEDICAL RECORD NO.:  [id number]  LOCATION:  2297                         FACILITY:  [facility]  PHYSICIAN:  Susa Day, M.D.    DATE OF BIRTH:  1935/01/11  DATE OF PROCEDURE:  07/26/2014 DATE OF DISCHARGE:                              OPERATIVE REPORT   PREOPERATIVE DIAGNOSIS:  Right intertrochanteric hip fracture.  POSTOPERATIVE DIAGNOSIS:  Right intertrochanteric hip fracture.  PROCEDURES PERFORMED:  Intramedullary nailing of the right femur.  ANESTHESIA:  General.  ASSISTANT:  No assistant.  HISTORY:  This 80 year old with intertrochanteric fracture, slightly displaced indicated for open reduction and internal fixation with trochanteric nail.  Risk and benefits discussed including bleeding, infection, DVT, PE, nonunion, inability to heal, anesthetic complications, etc.  TECHNIQUE:  The patient in supine position, after induction of adequate general anesthesia, 2 g Kefzol, 600 clindamycin.  She was an insulin- dependent diabetic, and sub total treatment with mupirocin.  The patient was placed on the Hana table in the appropriate position.  Perineal post well padded.  Foley to gravity.  Right lower extremity longitudinal traction, internal rotation to provide reduction to the fracture, well leg gentle flexion, external rotation, and adduction.  We reduced the fracture.  I then proceeded with prepping and draping the hip peritrochanteric region and prepped in its usual sterile fashion.  Next, incision was made proximal to the trochanter through the skin and the fascia lata.  Subcutaneous tissue was dissected.  Electrocautery was utilized to achieve hemostasis.  The tip of the trochanter was identified and a guide pin was then advanced into the canal.  Some comminution noted posteriorly.  We over reamed proximally, and selected the 11 Affixus short nail 130 degree, inserted it by hand without difficulty into the  appropriate position.  Then with the external alignment guide, made a small incision laterally and advanced a guide pin up into the femoral head and neck, satisfactory in the AP and lateral plane.  This was then reamed to a 95.  We selected a 95 lag screw.  This was inserted with excellent purchase.  Verified in the AP and lateral plane.  This was then locked.  We then compressed at the fracture site after releasing traction and then locked it proximally. We fixed it distally with a static locking screw using the external alignment guide, small incision laterally, drilled and then inserted a 34 mm length screw excellent purchase.  X-rays were obtained in the AP and lateral plane at the hip and distally excellent placement in reduction.  No other fractures noted.  Wound was copiously irrigated. No active bleeding.  We closed the fascia approximately with 1 Vicryl, and the mid incision, then subcu with 2-0, and skin with staples.  Wound was dressed sterilely.  Released from the [facility] table on to the hospital bed, good rotation, leg lengths, good pulses, extubated, and transported to the recovery room in satisfactory condition.  The patient tolerated the procedure well.  No complications. No assistant was used to the case just the orthopedic technician.  50 mL of blood loss.     Susa Day, M.D.     Geralynn Rile  D:  07/26/2014  T:  07/26/2014  Job:  [id number]

## 2014-07-26 NOTE — Progress Notes (Signed)
[facility] PROGRESS NOTE  [name] KWI:[id number] DOB: [birth year] DOA: 07/25/2014 PCP: Mathews Argyle, MD  Assessment/Plan: #1 right intertrochanteric femur fracture Secondary to fall. Status post I am nail right intertrochanter per orthopedics. PT/OT. Pain management. Will place patient on scheduled Tylenol. Per orthopedics.  #2 hypothyroidism TSH is 0.189. Decrease Synthroid to 137.5 mcg daily.  #3 diabetes mellitus type 2 Hemoglobin A1c pending. Continue current dose of Levemir. Sliding scale insulin.  #4 hyperlipidemia Continue Lipitor.  #5 tobacco abuse Tobacco cessation. Nicotine patch.  #6 right knee effusion Per orthopedics.  #7 prophylaxis SCD for DVT prophylaxis.  Code Status: Full Family Communication: Updated patient. No family at bedside. Disposition Plan: SNF versus home with home health pending PT evaluation   Consultants:  Orthopedics: Dr Tonita Cong  Procedures:  X-ray of the right knee 07/25/2014  X-ray of the right elbow 07/25/2014  Plain films of the right hip and pelvis 07/25/2014    IM nail right intertrochanteric 07/26/2014 Dr. Tonita Cong  An 07/25/2014 tibiotics:  None  HPI/Subjective: Patient complaining of right hip pain. Patient just returned from operating room. Patient somewhat confused.  Objective: Filed Vitals:   07/26/14 1227  BP: 129/54  Pulse: 89  Temp: 97.8 F (36.6 C)  Resp: 16    Intake/Output Summary (Last 24 hours) at 07/26/14 1339 Last data filed at 07/26/14 1104  Gross per 24 hour  Intake 1358.75 ml  Output   1240 ml  Net 118.75 ml   Filed Weights   07/25/14 2337  Weight: 70.58 kg (155 lb 9.6 oz)    Exam:   General:  NAD  Cardiovascular: RRR  Respiratory: CTAB anterior lung fields  Abdomen: Soft, nontender, nondistended, positive bowel sounds.  Musculoskeletal: No clubbing cyanosis or edema. Right hip with ice pack on   Data Reviewed: Basic Metabolic Panel:  Recent Labs Lab  07/25/14 1820 07/26/14 0544  NA 141 140  K 4.1 3.7  CL 109 106  CO2 25 25  GLUCOSE 238* 242*  BUN 24* 22*  CREATININE 0.84 0.60  CALCIUM 9.6 9.0   Liver Function Tests:  Recent Labs Lab 07/26/14 0544  AST 19  ALT 24  ALKPHOS 48  BILITOT 0.9  PROT 6.2*  ALBUMIN 3.8   No results for input(s): LIPASE, AMYLASE in the last 168 hours. No results for input(s): AMMONIA in the last 168 hours. CBC:  Recent Labs Lab 07/25/14 1820 07/26/14 0544  WBC 9.3 8.1  NEUTROABS 7.4  --   HGB 13.9 12.5  HCT 41.2 37.2  MCV 92.2 93.5  PLT 163 167   Cardiac Enzymes: No results for input(s): CKTOTAL, CKMB, CKMBINDEX, TROPONINI in the last 168 hours. BNP (last 3 results) No results for input(s): BNP in the last 8760 hours.  ProBNP (last 3 results) No results for input(s): PROBNP in the last 8760 hours.  CBG:  Recent Labs Lab 07/25/14 2334 07/26/14 0804 07/26/14 1046 07/26/14 1157  GLUCAP 229* 204* 158* 163*    Recent Results (from the past 240 hour(s))  MRSA PCR Screening     Status: None   Collection Time: 07/26/14  4:10 AM  Result Value Ref Range Status   MRSA by PCR NEGATIVE NEGATIVE Final    Comment:        The GeneXpert MRSA Assay (FDA approved for NASAL specimens only), is one component of a comprehensive MRSA colonization surveillance program. It is not intended to diagnose MRSA infection nor to guide or monitor treatment for MRSA infections.  Studies: Dg Elbow Complete Right  07/25/2014   CLINICAL DATA:  Right elbow pain after falling today while doing yard work.  EXAM: RIGHT ELBOW - COMPLETE 3+ VIEW  COMPARISON:  None.  FINDINGS: Mild posterior soft tissue swelling. No fracture, dislocation or effusion.  IMPRESSION: No fracture.   Electronically Signed   By: Claudie Revering M.D.   On: 07/25/2014 19:19   Dg Knee 1-2 Views Right  07/25/2014   CLINICAL DATA:  80 year old female tripped and fell in her yd. Pain. Initial encounter.  EXAM: RIGHT KNEE - 1-2  VIEW  COMPARISON:  Right femur series from today reported separately.  FINDINGS: Cross-table lateral AP and lateral views of the right knee. Moderate to large joint effusion. The patella appears intact. Medial and lateral joint space loss. Osteopenia. No acute fracture identified about the right knee. Calcified atherosclerosis in the visible right lower extremity.  IMPRESSION: Moderate joint effusion. No acute fracture or dislocation identified about the right knee.   Electronically Signed   By: Genevie Ann M.D.   On: 07/25/2014 19:19   Dg Hip Operative Unilat With Pelvis Right  07/26/2014   CLINICAL DATA:  Open reduction and internal fixation of right hip fracture.  EXAM: OPERATIVE right HIP (WITH PELVIS IF PERFORMED) 6 VIEWS  TECHNIQUE: Fluoroscopic spot image(s) were submitted for interpretation post-operatively.  FLUOROSCOPY TIME:  Fluoroscopy Time:  1 minutes 22 seconds.  Number of Acquired Images:  6  COMPARISON:  July 25, 2014.  FINDINGS: These images demonstrate surgical internal fixation of proximal right femoral fracture. Improved alignment of fracture components is noted.  IMPRESSION: Status post internal fixation of proximal right femoral fracture.   Electronically Signed   By: Marijo Conception, M.D.   On: 07/26/2014 10:29   Dg Hip Unilat With Pelvis 2-3 Views Right  07/25/2014   CLINICAL DATA:  Slipped and fell  EXAM: RIGHT HIP (WITH PELVIS) 2-3 VIEWS  COMPARISON:  01/22/2011.  FINDINGS: The patient is rotated. There is a mildly impacted right intertrochanteric proximal femoral fracture. Right femoral head is properly located. Irregularity of the right inferior pubic ramus is compatible with previous fracture site.  IMPRESSION: Mildly impacted right femoral intertrochanteric fracture.   Electronically Signed   By: Conchita Paris M.D.   On: 07/25/2014 19:22   Dg Femur, Min 2 Views Right  07/25/2014   CLINICAL DATA:  Right hip pain after falling while doing yard work today.  EXAM: RIGHT FEMUR 2 VIEWS   COMPARISON:  None.  FINDINGS: Comminuted right intertrochanteric fracture with varus angulation. There is also anterior displacement of the distal fragment. Atheromatous arterial calcifications. Mild right patellofemoral degenerative spur formation.  IMPRESSION: Right intertrochanteric fracture.   Electronically Signed   By: Claudie Revering M.D.   On: 07/25/2014 19:21    Scheduled Meds: . acetaminophen  500 mg Oral TID  . acidophilus  1 capsule Oral Daily  . aspirin EC  325 mg Oral BID PC  . bacitracin   Topical BID  . calcium-vitamin D  1 tablet Oral BID WC  .  ceFAZolin (ANCEF) IV  2 g Intravenous Q6H  . docusate sodium  100 mg Oral BID  . donepezil  10 mg Oral QHS  . fentaNYL      . insulin aspart  0-9 Units Subcutaneous TID WC  . insulin detemir  10 Units Subcutaneous QHS  . levothyroxine  137.5 mcg Oral QAC breakfast  . magnesium gluconate  1,000 mg Oral Daily  . multivitamin with  minerals  1 tablet Oral Daily  . nicotine  21 mg Transdermal Daily   Continuous Infusions: . sodium chloride 1,000 mL (07/26/14 1104)    Principal Problem:   Hip fracture, right Active Problems:   Hypothyroidism   Diabetes mellitus without complication   Essential hypertension   Osteoporosis   Hx Breast cancer, IDC, Stage II, right, receptor +, her 2 neg.   Tobacco abuse   Aphasia   Hip fracture   Hip fx    Time spent: 23 minutes    THOMPSON,DANIEL M.D. [facility] Pager [phone]. If 7PM-7AM, please contact night-coverage at [email or web address], password [facility] 07/26/2014, 1:39 PM  LOS: 1 day

## 2014-07-26 NOTE — Anesthesia Procedure Notes (Signed)
Procedure Name: Intubation Date/Time: 07/26/2014 9:07 AM Performed by: Anne Fu Pre-anesthesia Checklist: Patient identified, Emergency Drugs available, Suction available, Patient being monitored and Timeout performed Patient Re-evaluated:Patient Re-evaluated prior to inductionOxygen Delivery Method: Circle system utilized Preoxygenation: Pre-oxygenation with 100% oxygen Intubation Type: IV induction Ventilation: Mask ventilation without difficulty Laryngoscope Size: Mac and 4 Grade View: Grade I Tube type: Oral Tube size: 7.5 mm Number of attempts: 1 Airway Equipment and Method: Stylet Placement Confirmation: ETT inserted through vocal cords under direct vision,  positive ETCO2,  CO2 detector and breath sounds checked- equal and bilateral Secured at: 23 cm Tube secured with: Tape Dental Injury: Teeth and Oropharynx as per pre-operative assessment

## 2014-07-26 NOTE — Transfer of Care (Signed)
Immediate Anesthesia Transfer of Care Note  Patient: [name]  Procedure(s) Performed: Procedure(s): INTRAMEDULLARY (IM) NAIL INTERTROCHANTRIC (Right)  Patient Location: PACU  Anesthesia Type:General  Level of Consciousness:  sedated, patient cooperative and responds to stimulation  Airway & Oxygen Therapy:Patient Spontanous Breathing and Patient connected to face mask oxgen  Post-op Assessment:  Report given to PACU RN and Post -op Vital signs reviewed and stable  Post vital signs:  Reviewed and stable  Last Vitals:  Filed Vitals:   07/26/14 0828  BP: 143/71  Pulse: 85  Temp: 37.2 C  Resp: 16    Complications: No apparent anesthesia complications

## 2014-07-26 NOTE — Interval H&P Note (Signed)
History and Physical Interval Note:  07/26/2014 7:33 AM  [name]  has presented today for surgery, with the diagnosis of Right intertrochanteric femoral fracture  The various methods of treatment have been discussed with the patient and family. After consideration of risks, benefits and other options for treatment, the patient has consented to  Procedure(s): INTRAMEDULLARY (IM) NAIL INTERTROCHANTRIC (Right) as a surgical intervention .  The patient's history has been reviewed, patient examined, no change in status, stable for surgery.  I have reviewed the patient's chart and labs.  Questions were answered to the patient's satisfaction.     [name]

## 2014-07-26 NOTE — Anesthesia Preprocedure Evaluation (Addendum)
Anesthesia Evaluation  Patient identified by MRN, date of birth, ID band Patient awake    Reviewed: Allergy & Precautions, NPO status , Patient's Chart, lab work & pertinent test results  History of Anesthesia Complications Negative for: history of anesthetic complications  Airway Mallampati: II  TM Distance: >3 FB Neck ROM: Full    Dental  (+) Caps, Dental Advisory Given   Pulmonary Current Smoker,  breath sounds clear to auscultation        Cardiovascular hypertension (no meds), - anginaRhythm:Regular Rate:Normal     Neuro/Psych aricept for memory issuesCVA, No Residual Symptoms negative neurological ROS     GI/Hepatic negative GI ROS, Neg liver ROS,   Endo/Other  diabetes (glu 204), Oral Hypoglycemic Agents, Insulin DependentHypothyroidism   Renal/GU negative Renal ROS     Musculoskeletal   Abdominal   Peds  Hematology negative hematology ROS (+)   Anesthesia Other Findings Breast cancer  Reproductive/Obstetrics                            Anesthesia Physical Anesthesia Plan  ASA: III  Anesthesia Plan: General   Post-op Pain Management:    Induction: Intravenous  Airway Management Planned: Oral ETT  Additional Equipment:   Intra-op Plan:   Post-operative Plan: Extubation in OR  Informed Consent: I have reviewed the patients History and Physical, chart, labs and discussed the procedure including the risks, benefits and alternatives for the proposed anesthesia with the patient or authorized representative who has indicated his/her understanding and acceptance.   Dental advisory given  Plan Discussed with: CRNA and Surgeon  Anesthesia Plan Comments: (Plan routine monitors, GETA)        Anesthesia Quick Evaluation

## 2014-07-26 NOTE — Anesthesia Postprocedure Evaluation (Signed)
  Anesthesia Post-op Note  Patient: [name]  Procedure(s) Performed: Procedure(s): INTRAMEDULLARY (IM) NAIL INTERTROCHANTRIC (Right)  Patient Location: PACU  Anesthesia Type:General  Level of Consciousness: awake, alert  and patient cooperative  Airway and Oxygen Therapy: Patient Spontanous Breathing and Patient connected to nasal cannula oxygen  Post-op Pain: mild  Post-op Assessment: Post-op Vital signs reviewed, Patient's Cardiovascular Status Stable, Respiratory Function Stable, Patent Airway, No signs of Nausea or vomiting and Pain level controlled     RLE Motor Response: Purposeful movement RLE Sensation: Full sensation      Post-op Vital Signs: Reviewed and stable  Last Vitals:  Filed Vitals:   07/26/14 1030  BP: 125/55  Pulse: 98  Temp: 36.9 C  Resp: 14    Complications: No apparent anesthesia complications

## 2014-07-26 NOTE — Progress Notes (Signed)
OT Cancellation Note  Patient Details Name: [name] MRN: [id number] DOB: 1934/08/15   Cancelled Treatment:    Reason Eval/Treat Not Completed: Medical issues which prohibited therapy.  Noted order written to begin after sx.  Pt may have sx today; will await new orders.  Thanks.  Freeda Spivey 07/26/2014, 7:22 AM  Lesle Chris, OTR/L [phone] 07/26/2014

## 2014-07-27 ENCOUNTER — Encounter (HOSPITAL_COMMUNITY): Payer: Self-pay | Admitting: Specialist

## 2014-07-27 LAB — GLUCOSE, CAPILLARY
GLUCOSE-CAPILLARY: 264 mg/dL — AB (ref 65–99)
GLUCOSE-CAPILLARY: 296 mg/dL — AB (ref 65–99)
GLUCOSE-CAPILLARY: 294 mg/dL — AB (ref 65–99)
Glucose-Capillary: 196 mg/dL — ABNORMAL HIGH (ref 65–99)

## 2014-07-27 LAB — BASIC METABOLIC PANEL
Anion gap: 6 (ref 5–15)
BUN: 9 mg/dL (ref 6–20)
CALCIUM: 8.7 mg/dL — AB (ref 8.9–10.3)
CO2: 27 mmol/L (ref 22–32)
Chloride: 105 mmol/L (ref 101–111)
Creatinine, Ser: 0.44 mg/dL (ref 0.44–1.00)
GFR calc Af Amer: >60 mL/min (ref >60)
GFR calc non Af Amer: >60 mL/min (ref >60)
GLUCOSE: 213 mg/dL — AB (ref 65–99)
Potassium: 3.6 mmol/L (ref 3.5–5.1)
Sodium: 138 mmol/L (ref 135–145)

## 2014-07-27 LAB — CBC
HEMATOCRIT: 33.4 % — AB (ref 36.0–46.0)
HEMOGLOBIN: 11.3 g/dL — AB (ref 12.0–15.0)
MCH: 31.6 pg (ref 26.0–34.0)
MCHC: 33.8 g/dL (ref 30.0–36.0)
MCV: 93.3 fL (ref 78.0–100.0)
Platelets: 124 10*3/uL — ABNORMAL LOW (ref 150–400)
RBC: 3.58 MIL/uL — ABNORMAL LOW (ref 3.87–5.11)
RDW: 12.8 % (ref 11.5–15.5)
WBC: 7.2 10*3/uL (ref 4.0–10.5)

## 2014-07-27 LAB — HEMOGLOBIN A1C
Hgb A1c MFr Bld: 8.9 % — ABNORMAL HIGH (ref 4.8–5.6)
Mean Plasma Glucose: 209 mg/dL

## 2014-07-27 MED ORDER — TRAMADOL HCL 50 MG PO TABS
50.0000 mg | ORAL_TABLET | Freq: Four times a day (QID) | ORAL | Status: DC | PRN
  Administered 2014-07-28: 50 mg via ORAL
  Filled 2014-07-27: qty 1

## 2014-07-27 MED ORDER — MUPIROCIN 2 % EX OINT
TOPICAL_OINTMENT | CUTANEOUS | Status: AC
  Filled 2014-07-27: qty 22

## 2014-07-27 MED ORDER — INSULIN DETEMIR 100 UNIT/ML ~~LOC~~ SOLN
14.0000 [IU] | Freq: Every day | SUBCUTANEOUS | Status: DC
  Administered 2014-07-27: 14 [IU] via SUBCUTANEOUS
  Filled 2014-07-27: qty 0.14

## 2014-07-27 MED ORDER — LIP MEDEX EX OINT
TOPICAL_OINTMENT | CUTANEOUS | Status: AC
  Administered 2014-07-27: 03:00:00
  Filled 2014-07-27: qty 7

## 2014-07-27 NOTE — Clinical Social Work Note (Signed)
Clinical Social Work Assessment  Patient Details  Name: [name] MRN: [id number] Date of Birth: 1934-09-06  Date of referral:  07/27/14               Reason for consult:  Discharge Planning                Permission sought to share information with:  Family Supports Permission granted to share information::  Yes, Verbal Permission Granted  Name::     Daryll Brod  Agency::     Relationship::  caregiver  Contact Information:  [phone]  Housing/Transportation Living arrangements for the past 2 months:  [place] Information:  Patient Patient Interpreter Needed:  None Criminal Activity/Legal Involvement Pertinent to Current Situation/Hospitalization:  No - Comment as needed Significant Relationships:  Siblings, Friend Lives with:  Self Do you feel safe going back to the place where you live?  No Need for family participation in patient care:  Yes (Comment) (pt brother and pt caregiver at bedside and pt agreeable to them being involved in conversation)  Care giving concerns:  Pt admitted with hip fracture, s/p hip surgery. Pt resides at [facility] and [facility], but needing higher level of care given hip fracture.   Social Worker assessment / plan:  CSW received referral that pt admitted from [facility], but recommendation for skilled level of care at [facility] upon discharge.  CSW met with pt, pt brother, and pt caregiver at bedside. CSW introduced self and explained role. Pt confirmed that she is a resident at [facility] and [facility]. CSW discussed recommendation for rehab at [facility] prior to returning to [place] apartment. Pt expressed that she was hopeful that she would be able to return to independent living, but after working with PT recognizes that rehab at [facility] will be beneficial  prior to returning to independent living apartment. Pt brother and pt caregiver agree that rehab at skilled level of [place] is best plan following hospitalization.   CSW completed FL2 and sent clinical information to [facility] and [facility] and contacted admission coordinator at [facility] and left message regarding pt needs.  CSW to continue to follow to provide support and assist with pt discharge to [facility] SNF level of care when pt medically ready for discharge.  Employment status:  Retired Nurse, adult PT Recommendations:  [place] / Referral to community resources:  [place]  Patient/Family's Response to care:  Pt alert and oriented x 4. Pt pleasant and actively involved in conversation. Pt feels that rehab at [facility] will be best plan as she was hopeful that she would be more mobile, but recognizes that she will get increased therapy at rehab level of care at [facility].   Patient/Family's Understanding of and Emotional Response to Diagnosis, Current Treatment, and Prognosis:  Pt displays a good understanding about diagnosis and treatment plan. Pt agreeable to plan for rehab at SNF at [facility].  Emotional Assessment Appearance:  Appears stated age Attitude/Demeanor/Rapport:  Other (pt appropriate) Affect (typically observed):  Accepting, Adaptable, Pleasant Orientation:  Oriented to Self, Oriented to Place, Oriented to  Time Alcohol / Substance use:  Not Applicable Psych involvement (Current and /or in the community):  No (Comment)  Discharge Needs  Concerns to be addressed:  Discharge Planning Concerns Readmission within the last 30 days:  No Current discharge risk:  Physical Impairment Barriers to Discharge:  No Barriers Identified   Chester Sibert, [place], LCSW 07/27/2014, 2:03 PM  [phone]

## 2014-07-27 NOTE — Evaluation (Signed)
Occupational Therapy Evaluation Patient Details Name: [name] MRN: [id number] DOB: July 11, 1934 Today's Date: 07/27/2014    History of Present Illness 80 y.o. female with PMH of hyperlipidemia, diabetes mellitus, hypothyroidism, remote history of breast cancer (1996, post status of left lumpectomy, radiation and chemotherapy), stroke, chronic aphasia, who presents with right hip pain after fall.   Clinical Impression   Pt admitted with above, and presents to OT with generalized weakness, impaired balance, and h/o cognitive deficits.  Currently, she requires mod - max A for LB ADLs and min A for functional mobility.  Recommend SNF level rehab to allow her to regain independence with ADLs.  All further OT needs can be addressed at SNF.  Acute OT will sign off at this time.     Follow Up Recommendations  SNF    Equipment Recommendations  None recommended by OT    Recommendations for Other Services       Precautions / Restrictions Precautions Precautions: Fall Precaution Comments: Pt reports this as only fall in recent past - partner confirms this  Restrictions Weight Bearing Restrictions: Yes RLE Weight Bearing: Partial weight bearing RLE Partial Weight Bearing Percentage or Pounds: 50%      Mobility Bed Mobility               General bed mobility comments: NT-up in chair  Transfers Overall transfer level: Needs assistance Equipment used: Rolling walker (2 wheeled) Transfers: Sit to/from Stand Sit to Stand: Min assist         General transfer comment: verbal cues for hand placement and PWB Rt LE     Balance                                            ADL Overall ADL's : Needs assistance/impaired Eating/Feeding: Independent   Grooming: Wash/dry hands;Wash/dry face;Oral care;Brushing hair;Set up;Sitting   Upper Body Bathing: Set up;Sitting   Lower Body Bathing: Moderate assistance;Sit to/from stand   Upper Body Dressing : Set  up;Sitting   Lower Body Dressing: Maximal assistance;Sit to/from stand Lower Body Dressing Details (indicate cue type and reason): Pt able to don/doff Lt sock, but unable to access Rt foot due to increased pain  Toilet Transfer: Minimal assistance;Stand-pivot;BSC   Toileting- Clothing Manipulation and Hygiene: Moderate assistance;Sit to/from stand Toileting - Clothing Manipulation Details (indicate cue type and reason): Assist to pull pants over hip s     Functional mobility during ADLs: Minimal assistance;Rolling walker       Vision     Perception     Praxis      Pertinent Vitals/Pain Pain Assessment: 0-10 Pain Score: 7  Pain Location: Rt hip and lt sided chest pain with UE wbing (RN notified and pt with complaint of chest pain with PT while WBIng on RW) Pain Descriptors / Indicators: Aching;Constant Pain Intervention(s): Monitored during session;Repositioned     Hand Dominance Right   Extremity/Trunk Assessment Upper Extremity Assessment Upper Extremity Assessment: Overall WFL for tasks assessed   Lower Extremity Assessment Lower Extremity Assessment: Defer to PT evaluation RLE Deficits / Details: knee extension -3/5, ankle WNL, hip 2/5, AAROM limited 50% by pain   Cervical / Trunk Assessment Cervical / Trunk Assessment: Normal   Communication Communication Communication: Expressive difficulties   Cognition Arousal/Alertness: Awake/alert Behavior During Therapy: WFL for tasks assessed/performed Overall Cognitive Status: History of cognitive impairments - at baseline (word  finding difficulty) Area of Impairment: Attention;Memory;Following commands;Safety/judgement;Awareness;Problem solving;Orientation Orientation Level: Time;Situation Current Attention Level: Sustained Memory: Decreased short-term memory;Decreased recall of precautions Following Commands: Follows one step commands consistently Safety/Judgement: Decreased awareness of safety;Decreased awareness of  deficits   Problem Solving: Requires verbal cues;Requires tactile cues General Comments: Pt's partner reports pt with h/o dementia, but cognition worse at this time    General Comments       Exercises       Shoulder Instructions      Home Living Family/patient expects to be discharged to:: Skilled nursing facility                                        Prior Functioning/Environment Level of Independence: Independent        Comments: pt was independent at ILF where she has a partner, has cane, BSC, tub seat, RW; partner assisted with shoping    OT Diagnosis: Generalized weakness;Cognitive deficits;Acute pain   OT Problem List: Decreased strength;Decreased activity tolerance;Impaired balance (sitting and/or standing);Decreased safety awareness;Decreased knowledge of use of DME or AE;Decreased knowledge of precautions;Pain   OT Treatment/Interventions:      OT Goals(Current goals can be found in the care plan section) Acute Rehab OT Goals Patient Stated Goal: to get better OT Goal Formulation: All assessment and education complete, DC therapy  OT Frequency:     Barriers to D/C:            Co-evaluation              End of Session Equipment Utilized During Treatment: Rolling walker Nurse Communication: Mobility status  Activity Tolerance: Patient limited by pain Patient left: in chair;with call bell/phone within reach;with nursing/sitter in room;with family/visitor present   Time: 7622-6333 OT Time Calculation (min): 24 min Charges:  OT General Charges $OT Visit: 1 Procedure OT Evaluation $Initial OT Evaluation Tier I: 1 Procedure OT Treatments $Self Care/Home Management : 8-22 mins G-Codes:    Yarenis Cerino M Jul 28, 2014, 3:13 PM

## 2014-07-27 NOTE — Progress Notes (Signed)
[facility] PROGRESS NOTE  Starlee Corralejo FYB:[id number] DOB: [birth year] DOA: 07/25/2014 PCP: Mathews Argyle, MD  Assessment/Plan: #1 right intertrochanteric femur fracture Secondary to fall. Status post IM nail right intertrochanter per orthopedics. PT/OT. Pain management. Per orthopedics.  #2 hypothyroidism TSH is 0.189. Decreased Synthroid to 137.5 mcg daily.  #3 diabetes mellitus type 2 Hemoglobin A1c is 8.9. CBC has ranged from 196- 266. Increase Levemir back to home dose of 14 units daily. Sliding scale insulin.  #4 hyperlipidemia Continue Lipitor.  #5 tobacco abuse Tobacco cessation. Nicotine patch.  #6 right knee effusion Per orthopedics.  #7 prophylaxis SCD for DVT prophylaxis.  Code Status: Full Family Communication: Updated patient. No family at bedside. Disposition Plan: SNF when medically stable and hopefully 1-2 days.   Consultants:  Orthopedics: Dr Tonita Cong  Procedures:  X-ray of the right knee 07/25/2014  X-ray of the right elbow 07/25/2014  Plain films of the right hip and pelvis 07/25/2014    IM nail right intertrochanteric 07/26/2014 Dr. Tonita Cong  An 07/25/2014 tibiotics:  None  HPI/Subjective: Patient sitting in chair. Patient states pain is controlled. No complaints.  Objective: Filed Vitals:   07/27/14 1400  BP: 123/51  Pulse: 90  Temp: 98.1 F (36.7 C)  Resp: 18    Intake/Output Summary (Last 24 hours) at 07/27/14 1434 Last data filed at 07/27/14 1400  Gross per 24 hour  Intake 2187.5 ml  Output   3750 ml  Net -1562.5 ml   Filed Weights   07/25/14 2337  Weight: 70.58 kg (155 lb 9.6 oz)    Exam:   General:  NAD  Cardiovascular: RRR  Respiratory: CTAB anterior lung fields  Abdomen: Soft, nontender, nondistended, positive bowel sounds.  Musculoskeletal: No clubbing cyanosis or edema.   Data Reviewed: Basic Metabolic Panel:  Recent Labs Lab 07/25/14 1820 07/26/14 0544 07/27/14 0503  NA 141 140 138   K 4.1 3.7 3.6  CL 109 106 105  CO2 25 25 27   GLUCOSE 238* 242* 213*  BUN 24* 22* 9  CREATININE 0.84 0.60 0.44  CALCIUM 9.6 9.0 8.7*   Liver Function Tests:  Recent Labs Lab 07/26/14 0544  AST 19  ALT 24  ALKPHOS 48  BILITOT 0.9  PROT 6.2*  ALBUMIN 3.8   No results for input(s): LIPASE, AMYLASE in the last 168 hours. No results for input(s): AMMONIA in the last 168 hours. CBC:  Recent Labs Lab 07/25/14 1820 07/26/14 0544 07/27/14 0503  WBC 9.3 8.1 7.2  NEUTROABS 7.4  --   --   HGB 13.9 12.5 11.3*  HCT 41.2 37.2 33.4*  MCV 92.2 93.5 93.3  PLT 163 167 124*   Cardiac Enzymes: No results for input(s): CKTOTAL, CKMB, CKMBINDEX, TROPONINI in the last 168 hours. BNP (last 3 results) No results for input(s): BNP in the last 8760 hours.  ProBNP (last 3 results) No results for input(s): PROBNP in the last 8760 hours.  CBG:  Recent Labs Lab 07/26/14 1157 07/26/14 1638 07/26/14 2131 07/27/14 0752 07/27/14 1224  GLUCAP 163* 208* 266* 196* 264*    Recent Results (from the past 240 hour(s))  MRSA PCR Screening     Status: None   Collection Time: 07/26/14  4:10 AM  Result Value Ref Range Status   MRSA by PCR NEGATIVE NEGATIVE Final    Comment:        The GeneXpert MRSA Assay (FDA approved for NASAL specimens only), is one component of a comprehensive MRSA colonization surveillance program. It is not intended  to diagnose MRSA infection nor to guide or monitor treatment for MRSA infections.      Studies: Dg Elbow Complete Right  07/25/2014   CLINICAL DATA:  Right elbow pain after falling today while doing yard work.  EXAM: RIGHT ELBOW - COMPLETE 3+ VIEW  COMPARISON:  None.  FINDINGS: Mild posterior soft tissue swelling. No fracture, dislocation or effusion.  IMPRESSION: No fracture.   Electronically Signed   By: Claudie Revering M.D.   On: 07/25/2014 19:19   Dg Knee 1-2 Views Right  07/25/2014   CLINICAL DATA:  80 year old female tripped and fell in her yd.  Pain. Initial encounter.  EXAM: RIGHT KNEE - 1-2 VIEW  COMPARISON:  Right femur series from today reported separately.  FINDINGS: Cross-table lateral AP and lateral views of the right knee. Moderate to large joint effusion. The patella appears intact. Medial and lateral joint space loss. Osteopenia. No acute fracture identified about the right knee. Calcified atherosclerosis in the visible right lower extremity.  IMPRESSION: Moderate joint effusion. No acute fracture or dislocation identified about the right knee.   Electronically Signed   By: Genevie Ann M.D.   On: 07/25/2014 19:19   Dg Hip Operative Unilat With Pelvis Right  07/26/2014   CLINICAL DATA:  Open reduction and internal fixation of right hip fracture.  EXAM: OPERATIVE right HIP (WITH PELVIS IF PERFORMED) 6 VIEWS  TECHNIQUE: Fluoroscopic spot image(s) were submitted for interpretation post-operatively.  FLUOROSCOPY TIME:  Fluoroscopy Time:  1 minutes 22 seconds.  Number of Acquired Images:  6  COMPARISON:  July 25, 2014.  FINDINGS: These images demonstrate surgical internal fixation of proximal right femoral fracture. Improved alignment of fracture components is noted.  IMPRESSION: Status post internal fixation of proximal right femoral fracture.   Electronically Signed   By: Marijo Conception, M.D.   On: 07/26/2014 10:29   Dg Hip Unilat With Pelvis 2-3 Views Right  07/25/2014   CLINICAL DATA:  Slipped and fell  EXAM: RIGHT HIP (WITH PELVIS) 2-3 VIEWS  COMPARISON:  01/22/2011.  FINDINGS: The patient is rotated. There is a mildly impacted right intertrochanteric proximal femoral fracture. Right femoral head is properly located. Irregularity of the right inferior pubic ramus is compatible with previous fracture site.  IMPRESSION: Mildly impacted right femoral intertrochanteric fracture.   Electronically Signed   By: Conchita Paris M.D.   On: 07/25/2014 19:22   Dg Femur, Min 2 Views Right  07/25/2014   CLINICAL DATA:  Right hip pain after falling while  doing yard work today.  EXAM: RIGHT FEMUR 2 VIEWS  COMPARISON:  None.  FINDINGS: Comminuted right intertrochanteric fracture with varus angulation. There is also anterior displacement of the distal fragment. Atheromatous arterial calcifications. Mild right patellofemoral degenerative spur formation.  IMPRESSION: Right intertrochanteric fracture.   Electronically Signed   By: Claudie Revering M.D.   On: 07/25/2014 19:21    Scheduled Meds: . acetaminophen  500 mg Oral TID  . acidophilus  1 capsule Oral Daily  . aspirin EC  325 mg Oral BID PC  . bacitracin   Topical BID  . calcium-vitamin D  1 tablet Oral BID WC  . docusate sodium  100 mg Oral BID  . donepezil  10 mg Oral QHS  . insulin aspart  0-9 Units Subcutaneous TID WC  . insulin detemir  10 Units Subcutaneous QHS  . levothyroxine  137.5 mcg Oral QAC breakfast  . magnesium gluconate  1,000 mg Oral Daily  . multivitamin with  minerals  1 tablet Oral Daily  . nicotine  21 mg Transdermal Daily   Continuous Infusions: . sodium chloride 75 mL/hr at 07/26/14 2300    Principal Problem:   Hip fracture, right Active Problems:   Hypothyroidism   Diabetes mellitus without complication   Essential hypertension   Osteoporosis   Hx Breast cancer, IDC, Stage II, right, receptor +, her 2 neg.   Tobacco abuse   Aphasia   Hip fracture   Hip fx   Knee effusion    Time spent: 19 minutes    Faydra Korman M.D. [facility] Pager [phone]. If 7PM-7AM, please contact night-coverage at [email or web address], password [facility] 07/27/2014, 2:34 PM  LOS: 2 days

## 2014-07-27 NOTE — Clinical Social Work Placement (Signed)
   CLINICAL SOCIAL WORK PLACEMENT  NOTE  Date:  07/27/2014  Patient Details  Name: [name] MRN: [id number] Date of Birth: 11-01-1934  Clinical Social Work is seeking post-discharge placement for this patient at the [place] level of care (*CSW will initial, date and re-position this form in  chart as items are completed):  No (pt from [facility] and plans for [facility] SN[facility] level of care upon discharge)   Patient/family provided with [facility] Work Department's list of facilities offering this level of care within the geographic area requested by the patient (or if unable, by the patient's family).      Patient/family informed of their freedom to choose among providers that offer the needed level of care, that participate in Medicare, Medicaid or managed care program needed by the patient, have an available bed and are willing to accept the patient.      Patient/family informed of [facility]'s ownership interest in [facility] and [facility], as well as of the fact that they are under no obligation to receive care at these facilities.  PASRR submitted to EDS on 07/27/14     PASRR number received on 07/27/14     Existing PASRR number confirmed on       FL2 transmitted to all facilities in geographic area requested by pt/family on 07/27/14     FL2 transmitted to all facilities within larger geographic area on       Patient informed that his/her managed care company has contracts with or will negotiate with certain facilities, including the following:            Patient/family informed of bed offers received.  Patient chooses bed at       Physician recommends and patient chooses bed at      Patient to be transferred to   on  .  Patient to be transferred to facility by       Patient family notified on   of transfer.  Name of family member notified:        PHYSICIAN Please sign FL2     Additional Comment:     _______________________________________________ Ladell Pier, LCSW 07/27/2014, 2:10 PM

## 2014-07-27 NOTE — Evaluation (Signed)
Physical Therapy Evaluation Patient Details Name: [name] MRN: [id number] DOB: 11-Feb-1934 Today's Date: 07/27/2014   History of Present Illness  79 y.o. female with PMH of hyperlipidemia, diabetes mellitus, hypothyroidism, remote history of breast cancer (1996, post status of left lumpectomy, radiation and chemotherapy), stroke, chronic aphasia, who presents with right hip pain after fall.  Clinical Impression  Pt admitted with above diagnosis. Pt currently with functional limitations due to the deficits listed below (see PT Problem List). Pt ambulated 24' with min A and RW. ST-SNF recommended for post acute rehab.  Pt will benefit from skilled PT to increase their independence and safety with mobility to allow discharge to the venue listed below.       Follow Up Recommendations SNF    Equipment Recommendations  None recommended by PT    Recommendations for Other Services       Precautions / Restrictions Precautions Precautions: Fall Precaution Comments: 3 falls in past year Restrictions Weight Bearing Restrictions: Yes RLE Weight Bearing: Partial weight bearing RLE Partial Weight Bearing Percentage or Pounds: 50%      Mobility  Bed Mobility               General bed mobility comments: NT-up in chair  Transfers Overall transfer level: Needs assistance Equipment used: Rolling walker (2 wheeled) Transfers: Sit to/from Stand Sit to Stand: Mod assist         General transfer comment: verbal cues for hand placment and for PWB on RLE  Ambulation/Gait Ambulation/Gait assistance: Min assist Ambulation Distance (Feet): 28 Feet Assistive device: Rolling walker (2 wheeled) Gait Pattern/deviations: Step-to pattern;Decreased step length - right;Antalgic   Gait velocity interpretation: Below normal speed for age/gender General Gait Details: min A to maneuver RW, verbal cues for sequencing and to incr step length  Stairs            Wheelchair Mobility     Modified Rankin (Stroke Patients Only)       Balance                                             Pertinent Vitals/Pain Pain Assessment: 0-10 Pain Score: 8  Pain Location: R hip Pain Descriptors / Indicators: Sore Pain Intervention(s): Monitored during session;Premedicated before session;Limited activity within patient's tolerance;Ice applied   While walking, pt reported L sided chest pain. BP in sitting 99/63, HR 84, SaO2 99% on RA. RN notified.     Home Living Family/patient expects to be discharged to:: Skilled nursing facility                      Prior Function Level of Independence: Independent         Comments: pt was independent at ILF where she has a roomate, has cane, BSC, tub seat, RW; roomate assisted with shoping     Hand Dominance        Extremity/Trunk Assessment   Upper Extremity Assessment: Overall WFL for tasks assessed           Lower Extremity Assessment: RLE deficits/detail RLE Deficits / Details: knee extension -3/5, ankle WNL, hip 2/5, AAROM limited 50% by pain    Cervical / Trunk Assessment: Normal  Communication   Communication: Expressive difficulties (word finding difficulty, this is baseline per her brother)  Cognition Arousal/Alertness: Awake/alert Behavior During Therapy: WFL for tasks assessed/performed Overall Cognitive Status:  History of cognitive impairments - at baseline (word finding difficulty)                      General Comments      Exercises General Exercises - Lower Extremity Ankle Circles/Pumps: AROM;Both;10 reps;Seated Long Arc Quad: AAROM;Right;10 reps;Seated      Assessment/Plan    PT Assessment Patient needs continued PT services  PT Diagnosis Difficulty walking;Acute pain   PT Problem List Decreased strength;Decreased activity tolerance;Decreased range of motion;Decreased mobility;Decreased balance;Decreased knowledge of use of DME;Pain  PT Treatment  Interventions Gait training;DME instruction;Functional mobility training;Therapeutic activities;Patient/family education;Therapeutic exercise   PT Goals (Current goals can be found in the Care Plan section) Acute Rehab PT Goals Patient Stated Goal: to decrease pain PT Goal Formulation: With patient/family Time For Goal Achievement: 08/10/14 Potential to Achieve Goals: Good    Frequency Min 3X/week   Barriers to discharge        Co-evaluation               End of Session Equipment Utilized During Treatment: Gait belt Activity Tolerance: Patient limited by pain Patient left: in chair;with call bell/phone within reach;with family/visitor present Nurse Communication: Mobility status (pt reported L sided chest pain, RN notified, vitals assessed and were stable)         Time: 1203-1227 PT Time Calculation (min) (ACUTE ONLY): 24 min   Charges:   PT Evaluation $Initial PT Evaluation Tier I: 1 Procedure PT Treatments $Gait Training: 8-22 mins   PT G Codes:        [name] 07/27/2014, 12:47 PM [phone]
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
## 2014-07-28 DIAGNOSIS — Z720 Tobacco use: Secondary | ICD-10-CM

## 2014-07-28 LAB — CBC
HCT: 33.3 % — ABNORMAL LOW (ref 36.0–46.0)
HEMOGLOBIN: 11.3 g/dL — AB (ref 12.0–15.0)
MCH: 31.2 pg (ref 26.0–34.0)
MCHC: 33.9 g/dL (ref 30.0–36.0)
MCV: 92 fL (ref 78.0–100.0)
Platelets: 122 10*3/uL — ABNORMAL LOW (ref 150–400)
RBC: 3.62 MIL/uL — ABNORMAL LOW (ref 3.87–5.11)
RDW: 12.8 % (ref 11.5–15.5)
WBC: 7.2 10*3/uL (ref 4.0–10.5)

## 2014-07-28 LAB — BASIC METABOLIC PANEL
ANION GAP: 10 (ref 5–15)
BUN: 11 mg/dL (ref 6–20)
CHLORIDE: 104 mmol/L (ref 101–111)
CO2: 25 mmol/L (ref 22–32)
CREATININE: 0.41 mg/dL — AB (ref 0.44–1.00)
Calcium: 8.7 mg/dL — ABNORMAL LOW (ref 8.9–10.3)
GFR calc Af Amer: >60 mL/min (ref >60)
GFR calc non Af Amer: >60 mL/min (ref >60)
Glucose, Bld: 244 mg/dL — ABNORMAL HIGH (ref 65–99)
POTASSIUM: 3.5 mmol/L (ref 3.5–5.1)
Sodium: 139 mmol/L (ref 135–145)

## 2014-07-28 LAB — GLUCOSE, CAPILLARY
Glucose-Capillary: 216 mg/dL — ABNORMAL HIGH (ref 65–99)
Glucose-Capillary: 241 mg/dL — ABNORMAL HIGH (ref 65–99)

## 2014-07-28 MED ORDER — DOCUSATE SODIUM 100 MG PO CAPS: 100.0000 mg | ORAL_CAPSULE | Freq: Two times a day (BID) | ORAL | Status: DC

## 2014-07-28 MED ORDER — LEVOTHYROXINE SODIUM 137 MCG PO TABS: 137.0000 ug | ORAL_TABLET | Freq: Every day | ORAL | Status: AC

## 2014-07-28 MED ORDER — POTASSIUM CHLORIDE CRYS ER 20 MEQ PO TBCR
40.0000 meq | EXTENDED_RELEASE_TABLET | Freq: Once | ORAL | Status: AC
  Administered 2014-07-28: 40 meq via ORAL
  Filled 2014-07-28: qty 2

## 2014-07-28 MED ORDER — BACITRACIN ZINC 500 UNIT/GM EX OINT: TOPICAL_OINTMENT | Freq: Two times a day (BID) | CUTANEOUS | Status: AC

## 2014-07-28 MED ORDER — TRAMADOL HCL 50 MG PO TABS: 50.0000 mg | ORAL_TABLET | Freq: Four times a day (QID) | ORAL | Status: AC | PRN

## 2014-07-28 NOTE — Progress Notes (Signed)
Pt for discharge to [facility] and [facility].  CSW facilitated pt discharge needs including contacting facility, faxing pt discharge information via [facility], discussing with pt, pt brother,and pt friends at bedside, providing RN phone number to call report, and scheduled ambulance transport for 1 pm pick up.   Pt coping appropriately with transition to rehab at [facility] prior to returning to her independent living apartment at [place]. Pt feels that the increased therapy will assist in her recovery.   No further social work needs identified at this time.  CSW signing off.   Alison Murray, MSW, [place] Work [phone]

## 2014-07-28 NOTE — Progress Notes (Signed)
Subjective: 2 Days Post-Op Procedure(s) (LRB): INTRAMEDULLARY (IM) NAIL INTERTROCHANTRIC (Right) Patient reports pain as mild.  Reporting some soreness about the hip this AM. No knee pain. No other c/o.  Objective: Vital signs in last 24 hours: Temp:  [98.1 F (36.7 C)-99 F (37.2 C)] 98.3 F (36.8 C) (06/21 0508) Pulse Rate:  [84-97] 86 (06/21 0508) Resp:  [16-18] 16 (06/21 0508) BP: (99-138)/(51-63) 129/60 mmHg (06/21 0508) SpO2:  [94 %-99 %] 95 % (06/21 0508)  Intake/Output from previous day: 06/20 0701 - 06/21 0700 In: 1312.5 [P.O.:720; I.V.:592.5] Out: 2200 [Urine:2200] Intake/Output this shift:     Recent Labs  07/25/14 1820 07/26/14 0544 07/27/14 0503 07/28/14 0525  HGB 13.9 12.5 11.3* 11.3*    Recent Labs  07/27/14 0503 07/28/14 0525  WBC 7.2 7.2  RBC 3.58* 3.62*  HCT 33.4* 33.3*  PLT 124* 122*    Recent Labs  07/27/14 0503 07/28/14 0525  NA 138 139  K 3.6 3.5  CL 105 104  CO2 27 25  BUN 9 11  CREATININE 0.44 0.41*  GLUCOSE 213* 244*  CALCIUM 8.7* 8.7*    Recent Labs  07/25/14 1820  INR 0.98    Neurologically intact ABD soft Neurovascular intact Sensation intact distally Intact pulses distally Dorsiflexion/Plantar flexion intact Incision: dressing C/D/I and no drainage No cellulitis present Compartment soft no calf pain or sign of DVT  Assessment/Plan: 2 Days Post-Op Procedure(s) (LRB): INTRAMEDULLARY (IM) NAIL INTERTROCHANTRIC (Right) Advance diet Up with therapy D/C IV fluids  PWB RLE 50% Anticipate SNF when medically stable for D/C per admitting service Will discuss with Dr. Mliss Fritz, [place]. 07/28/2014, 7:30 AM

## 2014-07-28 NOTE — Discharge Instructions (Signed)
Partial weight bearing right leg 50% Walker for support when ambulating, wheelchair if unable to follow 50% partial weight precautions Aquacel dressings may remain in place until follow up, if they become saturated may remove and place mepilex or gauze dressing which should be kept clean and dry. May shower with Aquacel in place but do not submerge in water Ice R hip as needed for pain Follow up with Dr. Tonita Cong 2 weeks after surgery for staple removal and xrays

## 2014-07-28 NOTE — Care Management Note (Signed)
Case Management Note  Patient Details  Name: Aveyah Greenwood MRN: [id number] Date of Birth: 1934/04/26  Subjective/Objective:             Admitted s/p Intramedullary nailing of the right femur       Action/Plan: Discharge planning per CSW  Expected Discharge Date:                  Expected Discharge Plan:  [place]  In-House Referral:  Clinical Social Work  Discharge planning Services  CM Consult  Post Acute Care Choice:    Choice offered to:  Patient  DME Arranged:    DME Agency:     HH Arranged:    [place] Agency:     Status of Service:  Completed, signed off  Medicare Important Message Given:  Yes Date Medicare IM Given:  07/28/14 Medicare IM give by:  Sunday Spillers RN CM Date Additional Medicare IM Given:    Additional Medicare Important Message give by:     If discussed at [place] of Stay Meetings, dates discussed:    Additional Comments:  Guadalupe Maple, RN 07/28/2014, 12:03 PM

## 2014-07-28 NOTE — Clinical Social Work Placement (Signed)
   CLINICAL SOCIAL WORK PLACEMENT  NOTE  Date:  07/28/2014  Patient Details  Name: [name] MRN: [id number] Date of Birth: [birth year]  Clinical Social Work is seeking post-discharge placement for this patient at the [place] level of care (*CSW will initial, date and re-position this form in  chart as items are completed):  No (pt from [facility] and plans for [facility] SNF level of care upon discharge)   Patient/family provided with [place] Work Department's list of facilities offering this level of care within the geographic area requested by the patient (or if unable, by the patient's family).      Patient/family informed of their freedom to choose among providers that offer the needed level of care, that participate in Medicare, Medicaid or managed care program needed by the patient, have an available bed and are willing to accept the patient.      Patient/family informed of [facility]'s ownership interest in [facility] and [facility], as well as of the fact that they are under no obligation to receive care at these facilities.  PASRR submitted to EDS on 07/27/14     PASRR number received on 07/27/14     Existing PASRR number confirmed on       FL2 transmitted to all facilities in geographic area requested by pt/family on 07/27/14     FL2 transmitted to all facilities within larger geographic area on       Patient informed that his/her managed care company has contracts with or will negotiate with certain facilities, including the following:        Yes   Patient/family informed of bed offers received.  Patient chooses bed at [facility]     Physician recommends and patient chooses bed at      Patient to be transferred to [facility] on 07/28/14.  Patient to be transferred to facility by ambulance Corey Harold)     Patient family notified on 07/28/14 of transfer.  Name of family member notified:  pt notified  at bedside along with pt brother and pt friend/caregiver, Santiago Glad     PHYSICIAN Please sign FL2     Additional Comment:    _______________________________________________ Ladell Pier, LCSW 07/28/2014, 11:28 AM

## 2014-07-28 NOTE — Progress Notes (Signed)
Physical Therapy Treatment Patient Details Name: [name] MRN: [id number] DOB: April 14, 1934 Today's Date: 07/28/2014    History of Present Illness 80 y.o. female with PMH of hyperlipidemia, diabetes mellitus, hypothyroidism, remote history of breast cancer (1996, post status of left lumpectomy, radiation and chemotherapy), stroke, chronic aphasia, who presents with right hip pain after fall. s/p IM nail.     PT Comments    Pt ambulated 10' with RW and min assist to advance RLE. Pt reported anxiety and pain with ambulation today. Performed RLE exercises with assist followed by ice.   Follow Up Recommendations  SNF     Equipment Recommendations  None recommended by PT    Recommendations for Other Services       Precautions / Restrictions Precautions Precautions: Fall Restrictions Weight Bearing Restrictions: Yes RLE Weight Bearing: Partial weight bearing RLE Partial Weight Bearing Percentage or Pounds: 50%    Mobility  Bed Mobility Overal bed mobility: Needs Assistance Bed Mobility: Supine to Sit     Supine to sit: Mod assist     General bed mobility comments: mod A for RLE and to raise trunk  Transfers Overall transfer level: Needs assistance Equipment used: Rolling walker (2 wheeled) Transfers: Sit to/from Stand Sit to Stand: Mod assist         General transfer comment: verbal cues for hand placment and for PWB on RLE  Ambulation/Gait Ambulation/Gait assistance: Min assist Ambulation Distance (Feet): 10 Feet Assistive device: Rolling walker (2 wheeled) Gait Pattern/deviations: Step-to pattern   Gait velocity interpretation: Below normal speed for age/gender General Gait Details: min A to maneuver RW, verbal cues for sequencing and to incr step length; initially required assist to advance RLE   Stairs            Wheelchair Mobility    Modified Rankin (Stroke Patients Only)       Balance Overall balance assessment: Needs assistance    Sitting balance-Leahy Scale: Good       Standing balance-Leahy Scale: Poor                      Cognition Arousal/Alertness: Awake/alert Behavior During Therapy: WFL for tasks assessed/performed   Area of Impairment: Attention;Memory;Following commands;Safety/judgement;Awareness;Problem solving;Orientation Orientation Level: Time;Situation Current Attention Level: Sustained Memory: Decreased short-term memory;Decreased recall of precautions Following Commands: Follows one step commands consistently Safety/Judgement: Decreased awareness of safety;Decreased awareness of deficits   Problem Solving: Requires verbal cues;Requires tactile cues General Comments: Pt's partner reports pt with h/o dementia, short term memory deficits, word finding difficulty    Exercises General Exercises - Lower Extremity Ankle Circles/Pumps: AROM;Both;10 reps;Seated Short Arc Quad: Right;AAROM;10 reps;Supine Heel Slides: AAROM;Right;15 reps;Supine Hip ABduction/ADduction: AAROM;Right;Supine;15 reps    General Comments        Pertinent Vitals/Pain Pain Score: 7  Pain Location: R hip with walking Pain Descriptors / Indicators: Sore Pain Intervention(s): Limited activity within patient's tolerance;Monitored during session;Premedicated before session;Ice applied    Home Living                      Prior Function            PT Goals (current goals can now be found in the care plan section) Acute Rehab PT Goals Patient Stated Goal: to be active again PT Goal Formulation: With patient/family Time For Goal Achievement: 08/10/14 Potential to Achieve Goals: Good Progress towards PT goals: Progressing toward goals    Frequency  Min 3X/week    PT  Plan Current plan remains appropriate    Co-evaluation             End of Session Equipment Utilized During Treatment: Gait belt Activity Tolerance: Patient limited by pain Patient left: in chair;with call bell/phone within  reach;with family/visitor present     Time: 2761-4709 PT Time Calculation (min) (ACUTE ONLY): 28 min  Charges:  $Gait Training: 8-22 mins $Therapeutic Exercise: 8-22 mins                    G Codes:      [name] 07/28/2014, 10:06 AM  [phone]

## 2014-07-28 NOTE — Progress Notes (Signed)
Report called to Jackson Latino, receiving RN at [facility].  All questions answered.

## 2014-07-28 NOTE — Progress Notes (Signed)
Inpatient Diabetes Program Recommendations  AACE/ADA: New Consensus Statement on Inpatient Glycemic Control (2013)  Target Ranges:  Prepandial:   less than 140 mg/dL      Peak postprandial:   less than 180 mg/dL (1-2 hours)      Critically ill patients:  140 - 180 mg/dL   Reason for Visit: Hyperglycemia  Results for NORI, WINEGAR (MRN [id number]) as of 07/28/2014 09:28  Ref. Range 07/27/2014 07:52 07/27/2014 12:24 07/27/2014 17:11 07/27/2014 21:47 07/28/2014 07:40  Glucose-Capillary Latest Ref Range: 65-99 mg/dL 196 (H) 264 (H) 296 (H) 294 (H) 216 (H)   Eating well. Hyperglycemia and elevated post-prandials.  May benefit from addition of Novolog 3 units tidwc for meal coverage insulin. Increase Novolog to moderate tidwc and hs.  Will continue to follow while inpatient.   Thank you. Lorenda Peck, RD, LDN, CDE Inpatient Diabetes Coordinator [phone]

## 2014-07-28 NOTE — Discharge Summary (Signed)
Physician Discharge Summary  [place] ZOX:[id number] DOB: 1934/09/23 DOA: 07/25/2014  PCP: Mathews Argyle, MD  Admit date: 07/25/2014 Discharge date: 07/28/2014  Time spent: 65 minutes  Recommendations for Outpatient Follow-up:  1. Patient be discharged to [facility] skilled nursing facility. Patient is to schedule a follow-up appointment with Dr. Tonita Cong of orthopedics in 2 weeks. 2. Patient is to follow-up with M.D. at the skilled nursing facility. Patient need repeat thyroid function studies done in about 4-6 weeks as patient's TSH was decreased during this hospitalization and the Synthroid dose was decreased. Patient will need a basic metabolic profile done in 1 week to follow-up on electrolytes and renal function.  Discharge Diagnoses:  Principal Problem:   Intertrochanteric fracture of right hip Active Problems:   Hypothyroidism   Diabetes mellitus without complication   Essential hypertension   Osteoporosis   Hx Breast cancer, IDC, Stage II, right, receptor +, her 2 neg.   Tobacco abuse   Aphasia   Hip fracture, right   Hip fracture   Hip fx   Knee effusion   Discharge Condition: Stable and improved  Diet recommendation: Carb modified diet  Filed Weights   07/25/14 2337  Weight: 70.58 kg (155 lb 9.6 oz)    History of present illness:  Per Dr Annamaria Helling is a 80 y.o. female with PMH of hyperlipidemia, diabetes mellitus, hypothyroidism, remote history of breast cancer (1996, post status of left lumpectomy, radiation and chemotherapy), stroke, chronic aphasia, who presented with right hip pain after fall.  Patient reported that she had fall when she was outside doing yard work and tripped on the edging of a walkway at about 5:00 PM. She fell backwards and landed on her buttock.She developed pain in her right hip, right elbow and knee. She did not hit her head or lose consciousness.No sensation change in her legs. She was not able to stand.She reported  having intermittent mild diarrhea, but no diarrhea on the day of admission.  At the time of admission, patient denied fever, chills, running nose, ear pain, headaches, cough, chest pain, SOB, abdominal pain, diarrhea, constipation, dysuria, urgency, frequency, hematuria or leg swelling. No unilateral weakness, numbness or tingling sensations. No vision change or hearing loss.  In ED, patient was found to have WBC 9.3, temperature normal, no tachycardia, electrolytes okay. X-ray showed mildly impacted right femoral intertrochanteric fracture. No bony fracture in right elbow and right knee x-ray. Patient was admitted to inpatient for further evaluation and treatment. Orthopedic surgeon was consulted by ED.  Hospital Course:  #1 right intertrochanteric femur fracture Secondary to fall. Patient was admitted and orthopedic consultation was obtained. Patient was seen in consultation by Dr. Tonita Cong. Patient subsequently underwent IM nail right intertrochanter on 07/26/2014 per orthopedics. Patient tolerated procedure without any complications. Patient was seen by physical therapy and occupational therapy. Patient's pain was managed per orthopedics. Patient be discharged in stable condition will follow-up with orthopedics as outpatient. Patient be discharged to a skilled nursing facility.  #2 hypothyroidism TSH was 0.189. Decreased Synthroid to 137 mcg daily. Patient will likely need repeat thyroid function studies done in about 4-6 weeks.  #3 diabetes mellitus type 2 Hemoglobin A1c is 8.9. Patient was initially placed on a decreased dose of her home Levemir 10 units daily while she was nothing by mouth and placed on a sliding scale insulin. Patient's Levemir was increased back to her home regimen of 14 units daily. Outpatient follow-up.  #4 hyperlipidemia Continued on home regimen of Lipitor.  #5  tobacco abuse Tobacco cessation. Nicotine patch was placed.  #6 right knee effusion Per  orthopedics.  Procedures:  X-ray of the right knee 07/25/2014  X-ray of the right elbow 07/25/2014  Plain films of the right hip and pelvis 07/25/2014   IM nail right intertrochanteric 07/26/2014 Dr. Tonita Cong    Consultations:  Orthopedics: Dr Tonita Cong  Discharge Exam: Filed Vitals:   07/28/14 0508  BP: 129/60  Pulse: 86  Temp: 98.3 F (36.8 C)  Resp: 16    General: NAD Cardiovascular: RRR Respiratory: CTAB  Discharge Instructions   Discharge Instructions    Diet Carb Modified    Complete by:  As directed      Increase activity slowly    Complete by:  As directed      Partial weight bearing    Complete by:  As directed   Laterality:  right  Extremity:  Lower          Current Discharge Medication List    START taking these medications   Details  aspirin EC 325 MG tablet Take 1 tablet (325 mg total) by mouth 2 (two) times daily after a meal. Qty: 30 tablet, Refills: 0    bacitracin ointment Apply topically 2 (two) times daily. To abrasions Qty: 120 g, Refills: 0    !! docusate sodium (COLACE) 100 MG capsule Take 1 capsule (100 mg total) by mouth 2 (two) times daily as needed for mild constipation. Qty: 20 capsule, Refills: 1    !! docusate sodium (COLACE) 100 MG capsule Take 1 capsule (100 mg total) by mouth 2 (two) times daily. Qty: 10 capsule, Refills: 0    HYDROcodone-acetaminophen (NORCO/VICODIN) 5-325 MG per tablet Take 1 tablet by mouth every 4 (four) hours as needed. Qty: 60 tablet, Refills: 0    traMADol (ULTRAM) 50 MG tablet Take 1 tablet (50 mg total) by mouth every 6 (six) hours as needed for moderate pain. Qty: 20 tablet, Refills: 0     !! - Potential duplicate medications found. Please discuss with provider.    CONTINUE these medications which have CHANGED   Details  levothyroxine (SYNTHROID, LEVOTHROID) 137 MCG tablet Take 1 tablet (137 mcg total) by mouth daily. Qty: 30 tablet, Refills: 0      CONTINUE these medications which have  NOT CHANGED   Details  atorvastatin (LIPITOR) 40 MG tablet Take 40 mg by mouth daily.      Calcium Carbonate-Vitamin D (CALTRATE 600+D PO) Take 1 tablet by mouth 2 (two) times daily.    denosumab (PROLIA) 60 MG/ML SOLN injection Inject 60 mg into the skin every 6 (six) months. Administer in upper arm, thigh, or abdomen    donepezil (ARICEPT) 10 MG tablet Take 10 mg by mouth at bedtime.    glipiZIDE (GLUCOTROL) 5 MG tablet Take 10 mg by mouth 2 (two) times daily. Refills: 0    LEVEMIR FLEXTOUCH 100 UNIT/ML Pen Inject 14 Units as directed at bedtime. Refills: 9    magnesium gluconate (MAGONATE) 500 MG tablet Take 1,000 mg by mouth daily.     metFORMIN (GLUCOPHAGE) 1000 MG tablet Take 1,000 mg by mouth 2 (two) times daily. Refills: 2    Multiple Vitamin (MULITIVITAMIN WITH MINERALS) TABS Take 1 tablet by mouth daily.        STOP taking these medications     aspirin 81 MG tablet        Allergies  Allergen Reactions  . Erythromycin Hives  . Fosamax [Alendronate Sodium] Other (See Comments)  Reaction Unknown   Follow-up Information    Follow up with BEANE,JEFFREY C, MD In 2 weeks.   Specialty:  Orthopedic Surgery   Why:  For suture removal   Contact information:   [address] [place] [address] [phone]       Please follow up.   Why:  f/u with MD at SNF       The results of significant diagnostics from this hospitalization (including imaging, microbiology, ancillary and laboratory) are listed below for reference.    Significant Diagnostic Studies: Dg Elbow Complete Right  07/25/2014   CLINICAL DATA:  Right elbow pain after falling today while doing yard work.  EXAM: RIGHT ELBOW - COMPLETE 3+ VIEW  COMPARISON:  None.  FINDINGS: Mild posterior soft tissue swelling. No fracture, dislocation or effusion.  IMPRESSION: No fracture.   Electronically Signed   By: Claudie Revering M.D.   On: 07/25/2014 19:19   Dg Knee 1-2 Views Right  07/25/2014    CLINICAL DATA:  80 year old female tripped and fell in her yd. Pain. Initial encounter.  EXAM: RIGHT KNEE - 1-2 VIEW  COMPARISON:  Right femur series from today reported separately.  FINDINGS: Cross-table lateral AP and lateral views of the right knee. Moderate to large joint effusion. The patella appears intact. Medial and lateral joint space loss. Osteopenia. No acute fracture identified about the right knee. Calcified atherosclerosis in the visible right lower extremity.  IMPRESSION: Moderate joint effusion. No acute fracture or dislocation identified about the right knee.   Electronically Signed   By: Genevie Ann M.D.   On: 07/25/2014 19:19   Dg Hip Operative Unilat With Pelvis Right  07/26/2014   CLINICAL DATA:  Open reduction and internal fixation of right hip fracture.  EXAM: OPERATIVE right HIP (WITH PELVIS IF PERFORMED) 6 VIEWS  TECHNIQUE: Fluoroscopic spot image(s) were submitted for interpretation post-operatively.  FLUOROSCOPY TIME:  Fluoroscopy Time:  1 minutes 22 seconds.  Number of Acquired Images:  6  COMPARISON:  July 25, 2014.  FINDINGS: These images demonstrate surgical internal fixation of proximal right femoral fracture. Improved alignment of fracture components is noted.  IMPRESSION: Status post internal fixation of proximal right femoral fracture.   Electronically Signed   By: Marijo Conception, M.D.   On: 07/26/2014 10:29   Dg Hip Unilat With Pelvis 2-3 Views Right  07/25/2014   CLINICAL DATA:  Slipped and fell  EXAM: RIGHT HIP (WITH PELVIS) 2-3 VIEWS  COMPARISON:  01/22/2011.  FINDINGS: The patient is rotated. There is a mildly impacted right intertrochanteric proximal femoral fracture. Right femoral head is properly located. Irregularity of the right inferior pubic ramus is compatible with previous fracture site.  IMPRESSION: Mildly impacted right femoral intertrochanteric fracture.   Electronically Signed   By: Conchita Paris M.D.   On: 07/25/2014 19:22   Dg Femur, Min 2 Views  Right  07/25/2014   CLINICAL DATA:  Right hip pain after falling while doing yard work today.  EXAM: RIGHT FEMUR 2 VIEWS  COMPARISON:  None.  FINDINGS: Comminuted right intertrochanteric fracture with varus angulation. There is also anterior displacement of the distal fragment. Atheromatous arterial calcifications. Mild right patellofemoral degenerative spur formation.  IMPRESSION: Right intertrochanteric fracture.   Electronically Signed   By: Claudie Revering M.D.   On: 07/25/2014 19:21    Microbiology: Recent Results (from the past 240 hour(s))  MRSA PCR Screening     Status: None   Collection Time: 07/26/14  4:10 AM  Result Value Ref Range Status   MRSA by PCR NEGATIVE NEGATIVE Final    Comment:        The GeneXpert MRSA Assay (FDA approved for NASAL specimens only), is one component of a comprehensive MRSA colonization surveillance program. It is not intended to diagnose MRSA infection nor to guide or monitor treatment for MRSA infections.      Labs: Basic Metabolic Panel:  Recent Labs Lab 07/25/14 1820 07/26/14 0544 07/27/14 0503 07/28/14 0525  NA 141 140 138 139  K 4.1 3.7 3.6 3.5  CL 109 106 105 104  CO2 25 25 27 25   GLUCOSE 238* 242* 213* 244*  BUN 24* 22* 9 11  CREATININE 0.84 0.60 0.44 0.41*  CALCIUM 9.6 9.0 8.7* 8.7*   Liver Function Tests:  Recent Labs Lab 07/26/14 0544  AST 19  ALT 24  ALKPHOS 48  BILITOT 0.9  PROT 6.2*  ALBUMIN 3.8   No results for input(s): LIPASE, AMYLASE in the last 168 hours. No results for input(s): AMMONIA in the last 168 hours. CBC:  Recent Labs Lab 07/25/14 1820 07/26/14 0544 07/27/14 0503 07/28/14 0525  WBC 9.3 8.1 7.2 7.2  NEUTROABS 7.4  --   --   --   HGB 13.9 12.5 11.3* 11.3*  HCT 41.2 37.2 33.4* 33.3*  MCV 92.2 93.5 93.3 92.0  PLT 163 167 124* 122*   Cardiac Enzymes: No results for input(s): CKTOTAL, CKMB, CKMBINDEX, TROPONINI in the last 168 hours. BNP: BNP (last 3 results) No results for input(s): BNP  in the last 8760 hours.  ProBNP (last 3 results) No results for input(s): PROBNP in the last 8760 hours.  CBG:  Recent Labs Lab 07/27/14 0752 07/27/14 1224 07/27/14 1711 07/27/14 2147 07/28/14 0740  GLUCAP 196* 264* 296* 294* 216*       Signed:  Kalen Ratajczak MD [facility] 07/28/2014, 10:05 AM

## 2014-08-07 ENCOUNTER — Emergency Department (HOSPITAL_COMMUNITY)
Admission: EM | Admit: 2014-08-07 | Discharge: 2014-08-07 | Disposition: A | Discharge status: Home / Self Care | Payer: Medicare Other | Attending: Emergency Medicine | Admitting: Emergency Medicine

## 2014-08-07 ENCOUNTER — Encounter (HOSPITAL_COMMUNITY): Payer: Self-pay | Admitting: Emergency Medicine

## 2014-08-07 DIAGNOSIS — M79662 Pain in left lower leg: Secondary | ICD-10-CM | POA: Insufficient documentation

## 2014-08-07 DIAGNOSIS — Z79899 Other long term (current) drug therapy: Secondary | ICD-10-CM | POA: Insufficient documentation

## 2014-08-07 DIAGNOSIS — N390 Urinary tract infection, site not specified: Secondary | ICD-10-CM | POA: Diagnosis not present

## 2014-08-07 DIAGNOSIS — Z859 Personal history of malignant neoplasm, unspecified: Secondary | ICD-10-CM | POA: Diagnosis not present

## 2014-08-07 DIAGNOSIS — E119 Type 2 diabetes mellitus without complications: Secondary | ICD-10-CM | POA: Diagnosis not present

## 2014-08-07 DIAGNOSIS — M79605 Pain in left leg: Secondary | ICD-10-CM

## 2014-08-07 DIAGNOSIS — Z8673 Personal history of transient ischemic attack (TIA), and cerebral infarction without residual deficits: Secondary | ICD-10-CM | POA: Insufficient documentation

## 2014-08-07 DIAGNOSIS — Z720 Tobacco use: Secondary | ICD-10-CM | POA: Diagnosis not present

## 2014-08-07 DIAGNOSIS — Z7982 Long term (current) use of aspirin: Secondary | ICD-10-CM | POA: Diagnosis not present

## 2014-08-07 LAB — COMPREHENSIVE METABOLIC PANEL
ALBUMIN: 3.7 g/dL (ref 3.5–5.0)
ALT: 32 U/L (ref 14–54)
AST: 25 U/L (ref 15–41)
Alkaline Phosphatase: 144 U/L — ABNORMAL HIGH (ref 38–126)
Anion gap: 13 (ref 5–15)
BUN: 20 mg/dL (ref 6–20)
CO2: 26 mmol/L (ref 22–32)
Calcium: 9.2 mg/dL (ref 8.9–10.3)
Chloride: 97 mmol/L — ABNORMAL LOW (ref 101–111)
Creatinine, Ser: 0.58 mg/dL (ref 0.44–1.00)
GFR calc Af Amer: >60 mL/min (ref >60)
GFR calc non Af Amer: >60 mL/min (ref >60)
Glucose, Bld: 298 mg/dL — ABNORMAL HIGH (ref 65–99)
Potassium: 3.8 mmol/L (ref 3.5–5.1)
SODIUM: 136 mmol/L (ref 135–145)
TOTAL PROTEIN: 6.6 g/dL (ref 6.5–8.1)
Total Bilirubin: 0.7 mg/dL (ref 0.3–1.2)

## 2014-08-07 LAB — CBC WITH DIFFERENTIAL/PLATELET
Basophils Absolute: 0 10*3/uL (ref 0.0–0.1)
Basophils Relative: 0 % (ref 0–1)
Eosinophils Absolute: 0.1 10*3/uL (ref 0.0–0.7)
Eosinophils Relative: 1 % (ref 0–5)
HEMATOCRIT: 36.7 % (ref 36.0–46.0)
Hemoglobin: 12 g/dL (ref 12.0–15.0)
LYMPHS ABS: 1.3 10*3/uL (ref 0.7–4.0)
Lymphocytes Relative: 11 % — ABNORMAL LOW (ref 12–46)
MCH: 30.4 pg (ref 26.0–34.0)
MCHC: 32.7 g/dL (ref 30.0–36.0)
MCV: 92.9 fL (ref 78.0–100.0)
Monocytes Absolute: 1 10*3/uL (ref 0.1–1.0)
Monocytes Relative: 9 % (ref 3–12)
Neutro Abs: 9.5 10*3/uL — ABNORMAL HIGH (ref 1.7–7.7)
Neutrophils Relative %: 79 % — ABNORMAL HIGH (ref 43–77)
Platelets: 271 10*3/uL (ref 150–400)
RBC: 3.95 MIL/uL (ref 3.87–5.11)
RDW: 13.1 % (ref 11.5–15.5)
WBC: 11.9 10*3/uL — ABNORMAL HIGH (ref 4.0–10.5)

## 2014-08-07 LAB — URINALYSIS, ROUTINE W REFLEX MICROSCOPIC
Bilirubin Urine: NEGATIVE
Glucose, UA: >1000 mg/dL — AB
Hgb urine dipstick: NEGATIVE
Ketones, ur: NEGATIVE mg/dL
Leukocytes, UA: NEGATIVE
Nitrite: NEGATIVE
PH: 6 (ref 5.0–8.0)
PROTEIN: NEGATIVE mg/dL
Specific Gravity, Urine: 1.028 (ref 1.005–1.030)
Urobilinogen, UA: 1 mg/dL (ref 0.0–1.0)

## 2014-08-07 LAB — URINE MICROSCOPIC-ADD ON

## 2014-08-07 MED ORDER — CEPHALEXIN 500 MG PO CAPS
500.0000 mg | ORAL_CAPSULE | Freq: Once | ORAL | Status: AC
  Administered 2014-08-07: 500 mg via ORAL
  Filled 2014-08-07: qty 1

## 2014-08-07 MED ORDER — CEPHALEXIN 500 MG PO CAPS: 500.0000 mg | ORAL_CAPSULE | Freq: Two times a day (BID) | ORAL | Status: AC

## 2014-08-07 MED ORDER — ENOXAPARIN SODIUM 100 MG/ML ~~LOC~~ SOLN
100.0000 mg | Freq: Once | SUBCUTANEOUS | Status: AC
  Administered 2014-08-07: 100 mg via SUBCUTANEOUS
  Filled 2014-08-07: qty 1

## 2014-08-07 NOTE — ED Provider Notes (Signed)
CSN: [id number]     Arrival date & time 08/07/14  2009 History   First MD Initiated Contact with Patient 08/07/14 2025     Chief Complaint  Patient presents with  . Leg Pain     (Consider location/radiation/quality/duration/timing/severity/associated sxs/prior Treatment) Patient is a 80 y.o. female presenting with leg pain. The history is provided by the patient and a friend. The history is limited by the condition of the patient.  Leg Pain  Pt history limited by her aphasia and possibly some dementia  Ms. Eshal Propps is a 80 year old female with history of right intertrochanteric fracture with intramedullary nailing in the OR 2 weeks ago, diabetes, hypertension, hypothyroid, osteoporosis and aphasia, was brought to the ER today by EMS for sudden onset left lower extremity pain, behind her knee and in her calf, which is made worse by palpation or dorsiflexion.  There is no redness or obvious edema of her left leg.  She reports a history of DVTs, roughly 15 years ago.  It does not appear that she is on anticoagulates after surgery.  She has been getting physical therapy daily since her surgery.  She is a resident of [facility] with skilled nursing and rehab at their [facility].  She was with them today before PT, when she complained of leg pain.  They ordered a d-dimer at their facility and then she called 911 so that she would be able to get a work up tonight in the ER.  She denies any fever, palpitations, SOB, cough, abdominal pain, N/V, weakness, fatigue or any other symptoms at this time.   The patient later reported to the nurse dysuria symptoms, who then got a urine sample.  With reevaluation of the patient, she does complain of 3 days of dysuria symptoms, apparently reported at the nursing facility, and after 2 days got a urine sample, it is unclear whether or not the patient is being treated for it. She denies any flank pain, N/V/D, chills or sweats.    Past  Medical History  Diagnosis Date  . Diabetes mellitus   . Stroke   . Tobacco abuse   . Aphasia   . Cancer    Past Surgical History  Procedure Laterality Date  . Breast surgery    . Intramedullary (im) nail intertrochanteric Right 07/26/2014    Procedure: INTRAMEDULLARY (IM) NAIL INTERTROCHANTRIC;  Surgeon: Susa Day, MD;  Location: [facility] ORS;  Service: Orthopedics;  Laterality: Right;   Family History  Problem Relation Age of Onset  . Dementia Mother   . Congestive Heart Failure Father    History  Substance Use Topics  . Smoking status: Current Some Day Smoker  . Smokeless tobacco: Never Used  . Alcohol Use: No   OB History    No data available     Review of Systems 10 Systems reviewed and are negative for acute change except as noted in the HPI.    Allergies  Erythromycin and Fosamax  Home Medications   Prior to Admission medications   Medication Sig Start Date End Date Taking? Authorizing Provider  aspirin EC 325 MG tablet Take 1 tablet (325 mg total) by mouth 2 (two) times daily after a meal. 07/26/14  Yes Susa Day, MD  atorvastatin (LIPITOR) 40 MG tablet Take 40 mg by mouth daily.     Yes Historical Provider, MD  busPIRone (BUSPAR) 5 MG tablet Take 5 mg by mouth 2 (two) times daily.   Yes Historical Provider, MD  Calcium Carbonate-Vitamin D (CALTRATE 600+D PO) Take 1 tablet by mouth 2 (two) times daily.   Yes Historical Provider, MD  docusate sodium (COLACE) 100 MG capsule Take 1 capsule (100 mg total) by mouth 2 (two) times daily. 07/28/14  Yes Eugenie Filler, MD  donepezil (ARICEPT) 10 MG tablet Take 10 mg by mouth at bedtime. 07/13/14  Yes Historical Provider, MD  glipiZIDE (GLUCOTROL) 5 MG tablet Take 10 mg by mouth 2 (two) times daily. 05/14/14  Yes Historical Provider, MD  HYDROcodone-acetaminophen (NORCO/VICODIN) 5-325 MG per tablet Take 1 tablet by mouth every 4 (four) hours as needed. 07/26/14  Yes Susa Day, MD  LEVEMIR FLEXTOUCH 100 UNIT/ML Pen  Inject 14 Units as directed at bedtime. 07/01/14  Yes Historical Provider, MD  levothyroxine (SYNTHROID, LEVOTHROID) 137 MCG tablet Take 1 tablet (137 mcg total) by mouth daily. 07/28/14  Yes Eugenie Filler, MD  magnesium gluconate (MAGONATE) 500 MG tablet Take 1,000 mg by mouth daily.    Yes Historical Provider, MD  metFORMIN (GLUCOPHAGE) 1000 MG tablet Take 1,000 mg by mouth 2 (two) times daily. 06/17/14  Yes Historical Provider, MD  Multiple Vitamin (MULITIVITAMIN WITH MINERALS) TABS Take 1 tablet by mouth daily.     Yes Historical Provider, MD  traMADol (ULTRAM) 50 MG tablet Take 1 tablet (50 mg total) by mouth every 6 (six) hours as needed for moderate pain. 07/28/14  Yes Eugenie Filler, MD  bacitracin ointment Apply topically 2 (two) times daily. To abrasions Patient not taking: Reported on 08/07/2014 07/28/14   Eugenie Filler, MD  cephALEXin (KEFLEX) 500 MG capsule Take 1 capsule (500 mg total) by mouth 2 (two) times daily. 08/07/14 08/14/14  Delsa Grana, PA-C  denosumab (PROLIA) 60 MG/ML SOLN injection Inject 60 mg into the skin every 6 (six) months. Administer in upper arm, thigh, or abdomen    Historical Provider, MD  docusate sodium (COLACE) 100 MG capsule Take 1 capsule (100 mg total) by mouth 2 (two) times daily as needed for mild constipation. Patient not taking: Reported on 08/07/2014 07/26/14   Susa Day, MD   BP 122/60 mmHg  Pulse 94  Temp(Src) 98.6 F (37 C) (Oral)  Resp 17  SpO2 95% Physical Exam  Constitutional: She is oriented to person, place, and time. She appears well-developed and well-nourished. No distress.  HENT:  Head: Normocephalic and atraumatic.  Nose: Nose normal.  Mouth/Throat: Oropharynx is clear and moist. No oropharyngeal exudate.  Eyes: Conjunctivae and EOM are normal. Pupils are equal, round, and reactive to light. Right eye exhibits no discharge. Left eye exhibits no discharge. No scleral icterus.  Neck: Normal range of motion. No JVD present. No  tracheal deviation present. No thyromegaly present.  Cardiovascular: Normal rate, regular rhythm, normal heart sounds and intact distal pulses.  PMI is not displaced.  Exam reveals no gallop and no friction rub.   No murmur heard. Pulses:      Dorsalis pedis pulses are 2+ on the right side, and 2+ on the left side.       Posterior tibial pulses are 2+ on the right side, and 2+ on the left side.  No pitting edema, left calf 2 cm greater than right calf, tender to palpation of popliteal fossa and left calf, no redness or induration of LLE   Pulmonary/Chest: Effort normal and breath sounds normal. No accessory muscle usage. No tachypnea. No respiratory distress. She has no decreased breath sounds. She has no wheezes. She has no rales. She  exhibits no tenderness.  Abdominal: Soft. Normal appearance and bowel sounds are normal. She exhibits no distension and no mass. There is no tenderness. There is no rigidity, no rebound, no guarding and no CVA tenderness.  Musculoskeletal: Normal range of motion. She exhibits no edema or tenderness.  Lymphadenopathy:    She has no cervical adenopathy.  Neurological: She is alert and oriented to person, place, and time. She has normal reflexes. No cranial nerve deficit. She exhibits normal muscle tone. Coordination normal.  Skin: Skin is warm, dry and intact. No rash noted. She is not diaphoretic. No cyanosis or erythema. No pallor. Nails show no clubbing.  Psychiatric: She has a normal mood and affect. Her behavior is normal. Judgment and thought content normal.  Nursing note and vitals reviewed.   ED Course  Procedures (including critical care time) Labs Review Labs Reviewed  URINALYSIS, ROUTINE W REFLEX MICROSCOPIC (NOT AT [facility]) - Abnormal; Notable for the following:    APPearance CLOUDY (*)    Glucose, UA >1000 (*)    All other components within normal limits  CBC WITH DIFFERENTIAL/PLATELET - Abnormal; Notable for the following:    WBC 11.9 (*)     Neutrophils Relative % 79 (*)    Neutro Abs 9.5 (*)    Lymphocytes Relative 11 (*)    All other components within normal limits  COMPREHENSIVE METABOLIC PANEL - Abnormal; Notable for the following:    Chloride 97 (*)    Glucose, Bld 298 (*)    Alkaline Phosphatase 144 (*)    All other components within normal limits  URINE MICROSCOPIC-ADD ON - Abnormal; Notable for the following:    Squamous Epithelial / LPF FEW (*)    Bacteria, UA MANY (*)    All other components within normal limits    Imaging Review No results found.   EKG Interpretation None      MDM   Final diagnoses:  Pain of left lower extremity  UTI (lower urinary tract infection)    Pt with LLE pain in calf and behind knee, recent surgery on right hip fx, hx of DVT and hx of trauma + stasis, highly suspicious for DVT, w/o any sx of PE  Will treat with lovenox injection tonight, with order for LE duplex tomorrow morning.  I have called the facility where the pt's lives, skilled nursing and rehab [facility], they are expecting her arrival tonight with the help of her friend and fellow resident, and they will be able to bring her back in the am tomorrow for LE duplex.  Got baseline labs tonight, to check platelet, renal, liver fx, incase pt needs further treatment tomorrow, RN added UA which revealed UTI, will treat with dose of keflex here and will write Rx for keflex.  Medications  enoxaparin (LOVENOX) injection 100 mg (100 mg Subcutaneous Given 08/07/14 2217)  cephALEXin (KEFLEX) capsule 500 mg (500 mg Oral Given 08/07/14 2217)   Filed Vitals:   08/07/14 2130 08/07/14 2200 08/07/14 2230 08/07/14 2243  BP: 145/107 154/64 122/60 122/60  Pulse:    94  Temp:      TempSrc:      Resp:    17  SpO2:    95%          Delsa Grana, PA-C 08/08/14 0336  Tanna Furry, MD 08/12/14 1851

## 2014-08-07 NOTE — ED Notes (Signed)
Pt BIB EMS. Pt is from [facility]. Pt had R hip replacement surgery 2 weeks ago. Pt reports that now she has pain behind her L knee which radiates down to her L foot. Pt denies shortness of breath. Denies chest pain. Rates pain as 8/10. Pedal pulses intact bilat per EMS. No bilat LE edema noted. Pt alert, no acute distress. Skin warm, dry.

## 2014-08-07 NOTE — Discharge Instructions (Signed)

## 2014-08-07 NOTE — ED Notes (Signed)
Bed: WA06 Expected date:  Expected time:  Means of arrival:  Comments: EMS 48F SNF Leg pain/post op

## 2014-08-08 ENCOUNTER — Emergency Department (HOSPITAL_COMMUNITY)
Admission: EM | Admit: 2014-08-08 | Discharge: 2014-08-08 | Disposition: A | Discharge status: Home / Self Care | Payer: Medicare Other | Attending: Emergency Medicine | Admitting: Emergency Medicine

## 2014-08-08 ENCOUNTER — Other Ambulatory Visit: Payer: Self-pay

## 2014-08-08 ENCOUNTER — Ambulatory Visit (HOSPITAL_COMMUNITY)
Admission: RE | Admit: 2014-08-08 | Discharge: 2014-08-08 | Disposition: A | Discharge status: Home / Self Care | Payer: Medicare Other | Source: Ambulatory Visit | Attending: Emergency Medicine | Admitting: Emergency Medicine

## 2014-08-08 ENCOUNTER — Encounter (HOSPITAL_COMMUNITY): Payer: Self-pay | Admitting: Emergency Medicine

## 2014-08-08 DIAGNOSIS — Z7982 Long term (current) use of aspirin: Secondary | ICD-10-CM | POA: Insufficient documentation

## 2014-08-08 DIAGNOSIS — M79605 Pain in left leg: Secondary | ICD-10-CM | POA: Diagnosis present

## 2014-08-08 DIAGNOSIS — E119 Type 2 diabetes mellitus without complications: Secondary | ICD-10-CM | POA: Diagnosis not present

## 2014-08-08 DIAGNOSIS — Z9889 Other specified postprocedural states: Secondary | ICD-10-CM | POA: Insufficient documentation

## 2014-08-08 DIAGNOSIS — I82403 Acute embolism and thrombosis of unspecified deep veins of lower extremity, bilateral: Secondary | ICD-10-CM | POA: Diagnosis not present

## 2014-08-08 DIAGNOSIS — Z79899 Other long term (current) drug therapy: Secondary | ICD-10-CM | POA: Diagnosis not present

## 2014-08-08 DIAGNOSIS — I82412 Acute embolism and thrombosis of left femoral vein: Secondary | ICD-10-CM | POA: Insufficient documentation

## 2014-08-08 DIAGNOSIS — Z8673 Personal history of transient ischemic attack (TIA), and cerebral infarction without residual deficits: Secondary | ICD-10-CM | POA: Insufficient documentation

## 2014-08-08 DIAGNOSIS — M79606 Pain in leg, unspecified: Secondary | ICD-10-CM | POA: Diagnosis present

## 2014-08-08 DIAGNOSIS — M79609 Pain in unspecified limb: Secondary | ICD-10-CM

## 2014-08-08 DIAGNOSIS — Z720 Tobacco use: Secondary | ICD-10-CM | POA: Diagnosis not present

## 2014-08-08 DIAGNOSIS — Z859 Personal history of malignant neoplasm, unspecified: Secondary | ICD-10-CM | POA: Diagnosis not present

## 2014-08-08 MED ORDER — ENOXAPARIN SODIUM 100 MG/ML ~~LOC~~ SOLN: 1.0000 mg/kg | Freq: Two times a day (BID) | SUBCUTANEOUS | Status: AC

## 2014-08-08 MED ORDER — ENOXAPARIN SODIUM 100 MG/ML ~~LOC~~ SOLN
1.0000 mg/kg | Freq: Once | SUBCUTANEOUS | Status: AC
  Administered 2014-08-08: 70 mg via SUBCUTANEOUS
  Filled 2014-08-08: qty 1

## 2014-08-08 NOTE — ED Provider Notes (Signed)
CSN: [id number]     Arrival date & time 08/08/14  0846 History   First MD Initiated Contact with Patient 08/08/14 [phone]     Chief Complaint  Patient presents with  . DVT     (Consider location/radiation/quality/duration/timing/severity/associated sxs/prior Treatment) The history is provided by the patient.   80 year old female followed by Dr. Felipa Eth at [facility] rehabilitation facility. Patient status post right hip surgery for replacement 1 week ago. Patient referred into [facility] emergency department yesterday for complaint of some left leg pain. Was started on Lovenox and sent here this morning for Doppler studies. The upper studies were positive and patient referred here. Doppler study showing bilateral DVTs. Patient without a chest pain shortness of breath, no hypoxia, no tachycardia.  Past Medical History  Diagnosis Date  . Diabetes mellitus   . Stroke   . Tobacco abuse   . Aphasia   . Cancer    Past Surgical History  Procedure Laterality Date  . Breast surgery    . Intramedullary (im) nail intertrochanteric Right 07/26/2014    Procedure: INTRAMEDULLARY (IM) NAIL INTERTROCHANTRIC;  Surgeon: Susa Day, MD;  Location: [facility];  Service: Orthopedics;  Laterality: Right;   Family History  Problem Relation Age of Onset  . Dementia Mother   . Congestive Heart Failure Father    History  Substance Use Topics  . Smoking status: Current Some Day Smoker  . Smokeless tobacco: Never Used  . Alcohol Use: No   OB History    No data available     Review of Systems  Constitutional: Negative for fever.  HENT: Negative for congestion.   Eyes: Negative for redness.  Respiratory: Negative for shortness of breath.   Cardiovascular: Positive for leg swelling. Negative for chest pain and palpitations.  Gastrointestinal: Negative for abdominal pain.  Genitourinary: Negative for dysuria.  Musculoskeletal: Negative for back pain.  Skin: Negative for rash.  Neurological: Negative  for headaches.  Hematological: Does not bruise/bleed easily.  Psychiatric/Behavioral: Negative for confusion.      Allergies  Erythromycin and Fosamax  Home Medications   Prior to Admission medications   Medication Sig Start Date End Date Taking? Authorizing Provider  aspirin EC 325 MG tablet Take 1 tablet (325 mg total) by mouth 2 (two) times daily after a meal. 07/26/14  Yes Susa Day, MD  atorvastatin (LIPITOR) 40 MG tablet Take 40 mg by mouth daily.     Yes Historical Provider, MD  busPIRone (BUSPAR) 5 MG tablet Take 5 mg by mouth 2 (two) times daily.   Yes Historical Provider, MD  Calcium Carbonate-Vitamin D (CALTRATE 600+D PO) Take 1 tablet by mouth 2 (two) times daily.   Yes Historical Provider, MD  denosumab (PROLIA) 60 MG/ML SOLN injection Inject 60 mg into the skin every 6 (six) months. Administer in upper arm, thigh, or abdomen   Yes Historical Provider, MD  docusate sodium (COLACE) 100 MG capsule Take 1 capsule (100 mg total) by mouth 2 (two) times daily. 07/28/14  Yes Eugenie Filler, MD  donepezil (ARICEPT) 10 MG tablet Take 10 mg by mouth at bedtime. 07/13/14  Yes Historical Provider, MD  glipiZIDE (GLUCOTROL) 5 MG tablet Take 10 mg by mouth 2 (two) times daily. 05/14/14  Yes Historical Provider, MD  HYDROcodone-acetaminophen (NORCO/VICODIN) 5-325 MG per tablet Take 1 tablet by mouth every 4 (four) hours as needed. Patient taking differently: Take 1 tablet by mouth every 4 (four) hours as needed for moderate pain or severe pain.  07/26/14  Yes Susa Day, MD  LEVEMIR FLEXTOUCH 100 UNIT/ML Pen Inject 14 Units as directed at bedtime. 07/01/14  Yes Historical Provider, MD  levothyroxine (SYNTHROID, LEVOTHROID) 137 MCG tablet Take 1 tablet (137 mcg total) by mouth daily. 07/28/14  Yes Eugenie Filler, MD  magnesium gluconate (MAGONATE) 500 MG tablet Take 1,000 mg by mouth daily.    Yes Historical Provider, MD  metFORMIN (GLUCOPHAGE) 1000 MG tablet Take 1,000 mg by mouth 2  (two) times daily. 06/17/14  Yes Historical Provider, MD  Multiple Vitamin (MULITIVITAMIN WITH MINERALS) TABS Take 1 tablet by mouth daily.     Yes Historical Provider, MD  traMADol (ULTRAM) 50 MG tablet Take 1 tablet (50 mg total) by mouth every 6 (six) hours as needed for moderate pain. 07/28/14  Yes Eugenie Filler, MD  bacitracin ointment Apply topically 2 (two) times daily. To abrasions Patient not taking: Reported on 08/07/2014 07/28/14   Eugenie Filler, MD  cephALEXin (KEFLEX) 500 MG capsule Take 1 capsule (500 mg total) by mouth 2 (two) times daily. 08/07/14 08/14/14  Delsa Grana, PA-C  docusate sodium (COLACE) 100 MG capsule Take 1 capsule (100 mg total) by mouth 2 (two) times daily as needed for mild constipation. Patient not taking: Reported on 08/07/2014 07/26/14   Susa Day, MD  enoxaparin (LOVENOX) 100 MG/ML injection Inject 0.7 mLs (70 mg total) into the skin every 12 (twelve) hours. 08/08/14   Fredia Sorrow, MD   BP 126/55 mmHg  Pulse 74  Temp(Src) 97.9 F (36.6 C) (Oral)  Resp 12  Ht 5\' 7"  (1.702 m)  Wt 150 lb (68.04 kg)  BMI 23.49 kg/m2  SpO2 97% Physical Exam  Constitutional: She appears well-developed and well-nourished. No distress.  HENT:  Head: Normocephalic and atraumatic.  Mouth/Throat: Oropharynx is clear and moist.  Eyes: Conjunctivae and EOM are normal. Pupils are equal, round, and reactive to light.  Neck: Normal range of motion. Neck supple.  Cardiovascular: Normal rate, regular rhythm and normal heart sounds.   No murmur heard. Pulmonary/Chest: Effort normal and breath sounds normal. No respiratory distress.  Abdominal: Bowel sounds are normal. There is no tenderness.  Musculoskeletal: Normal range of motion. She exhibits no edema or tenderness.  No significant edema to either leg. No palpable cord. Dorsalis pedis pulses of 1+. Bilaterally.  Neurological: She is alert. No cranial nerve deficit. She exhibits normal muscle tone. Coordination normal.  Skin:  Skin is warm. There is erythema.  Slight erythema to the left medial aspect of the calf. No significant edema.  Nursing note and vitals reviewed.   ED Course  Procedures (including critical care time) Labs Review Labs Reviewed - No data to display Results for orders placed or performed during the hospital encounter of 08/07/14  Urinalysis, Routine w reflex microscopic (not at [facility])  Result Value Ref Range   Color, Urine YELLOW YELLOW   APPearance CLOUDY (A) CLEAR   Specific Gravity, Urine 1.028 1.005 - 1.030   pH 6.0 5.0 - 8.0   Glucose, UA >1000 (A) NEGATIVE mg/dL   Hgb urine dipstick NEGATIVE NEGATIVE   Bilirubin Urine NEGATIVE NEGATIVE   Ketones, ur NEGATIVE NEGATIVE mg/dL   Protein, ur NEGATIVE NEGATIVE mg/dL   Urobilinogen, UA 1.0 0.0 - 1.0 mg/dL   Nitrite NEGATIVE NEGATIVE   Leukocytes, UA NEGATIVE NEGATIVE  CBC with Differential/Platelet  Result Value Ref Range   WBC 11.9 (H) 4.0 - 10.5 K/uL   RBC 3.95 3.87 - 5.11 MIL/uL   Hemoglobin 12.0 12.0 -  15.0 g/dL   HCT 36.7 36.0 - 46.0 %   MCV 92.9 78.0 - 100.0 fL   MCH 30.4 26.0 - 34.0 pg   MCHC 32.7 30.0 - 36.0 g/dL   RDW 13.1 11.5 - 15.5 %   Platelets 271 150 - 400 K/uL   Neutrophils Relative % 79 (H) 43 - 77 %   Neutro Abs 9.5 (H) 1.7 - 7.7 K/uL   Lymphocytes Relative 11 (L) 12 - 46 %   Lymphs Abs 1.3 0.7 - 4.0 K/uL   Monocytes Relative 9 3 - 12 %   Monocytes Absolute 1.0 0.1 - 1.0 K/uL   Eosinophils Relative 1 0 - 5 %   Eosinophils Absolute 0.1 0.0 - 0.7 K/uL   Basophils Relative 0 0 - 1 %   Basophils Absolute 0.0 0.0 - 0.1 K/uL  Comprehensive metabolic panel  Result Value Ref Range   Sodium 136 135 - 145 mmol/L   Potassium 3.8 3.5 - 5.1 mmol/L   Chloride 97 (L) 101 - 111 mmol/L   CO2 26 22 - 32 mmol/L   Glucose, Bld 298 (H) 65 - 99 mg/dL   BUN 20 6 - 20 mg/dL   Creatinine, Ser 0.58 0.44 - 1.00 mg/dL   Calcium 9.2 8.9 - 10.3 mg/dL   Total Protein 6.6 6.5 - 8.1 g/dL   Albumin 3.7 3.5 - 5.0 g/dL   AST 25 15  - 41 U/L   ALT 32 14 - 54 U/L   Alkaline Phosphatase 144 (H) 38 - 126 U/L   Total Bilirubin 0.7 0.3 - 1.2 mg/dL   GFR calc non Af Amer >60 >60 mL/min   GFR calc Af Amer >60 >60 mL/min   Anion gap 13 5 - 15  Urine microscopic-add on  Result Value Ref Range   Squamous Epithelial / LPF FEW (A) RARE   WBC, UA 7-10 <3 WBC/hpf   Bacteria, UA MANY (A) RARE    Imaging Review No results found.   EKG Interpretation None      ED ECG REPORT   Date: 08/08/2014  Rate: 77  Rhythm: normal sinus rhythm  QRS Axis: normal  Intervals: normal  ST/T Wave abnormalities: nonspecific ST changes  Conduction Disutrbances:none  Narrative Interpretation:   Old EKG Reviewed: none available  I have personally reviewed the EKG tracing and agree with the computerized printout as noted.  EKG not crossing over from MUSE   MDM   Final diagnoses:  DVT (deep venous thrombosis), bilateral    Patient seen w [facility] yesterday. Concern was for deep vein thrombosis. Patient status post right hip surgery a week ago. Is at [facility] rehabilitation facility. Patient the had arrangements for Doppler studies of both lower extremities this morning was started on Lovenox yesterday. Doppler studies confirmed bilateral DVTs. Patient without any chest pain shortness of breath or hypoxia no tachycardia. Patient's labs from yesterday without any significant findings.  Patient will be dosed Lovenox here. Discussed with rehabilitation facility they will continue Lovenox there and less the better medical Dir. 1 switch to a different medication.    Fredia Sorrow, MD 08/08/14 1044

## 2014-08-08 NOTE — Discharge Instructions (Signed)
Deep Vein Thrombosis °A deep vein thrombosis (DVT) is a blood clot that develops in the deep, larger veins of the leg, arm, or pelvis. These are more dangerous than clots that might form in veins near the surface of the body. A DVT can lead to serious and even life-threatening complications if the clot breaks off and travels in the bloodstream to the lungs.  °A DVT can damage the valves in your leg veins so that instead of flowing upward, the blood pools in the lower leg. This is called post-thrombotic syndrome, and it can result in pain, swelling, discoloration, and sores on the leg. °CAUSES °Usually, several things contribute to the formation of blood clots. Contributing factors include: °· The flow of blood slows down. °· The inside of the vein is damaged in some way. °· You have a condition that makes blood clot more easily. °RISK FACTORS °Some people are more likely than others to develop blood clots. Risk factors include:  °· Smoking. °· Being overweight (obese). °· Sitting or lying still for a long time. This includes long-distance travel, paralysis, or recovery from an illness or surgery. °Other factors that increase risk are:  °· Older age, especially over 75 years of age. °· Having a family history of blood clots or if you have already had a blot clot. °· Having major or lengthy surgery. This is especially true for surgery on the hip, knee, or belly (abdomen). Hip surgery is particularly high risk. °· Having a long, thin tube (catheter) placed inside a vein during a medical procedure. °· Breaking a hip or leg. °· Having cancer or cancer treatment. °· Pregnancy and childbirth. °¨ Hormone changes make the blood clot more easily during pregnancy. °¨ The fetus puts pressure on the veins of the pelvis. °¨ There is a risk of injury to veins during delivery or a caesarean delivery. The risk is highest just after childbirth. °· Medicines containing the female hormone estrogen. This includes birth control pills and  hormone replacement therapy. °· Other circulation or heart problems. ° °SIGNS AND SYMPTOMS °When a clot forms, it can either partially or totally block the blood flow in that vein. Symptoms of a DVT can include: °· Swelling of the leg or arm, especially if one side is much worse. °· Warmth and redness of the leg or arm, especially if one side is much worse. °· Pain in an arm or leg. If the clot is in the leg, symptoms may be more noticeable or worse when standing or walking. °The symptoms of a DVT that has traveled to the lungs (pulmonary embolism, PE) usually start suddenly and include: °· Shortness of breath. °· Coughing. °· Coughing up blood or blood-tinged mucus. °· Chest pain. The chest pain is often worse with deep breaths. °· Rapid heartbeat. °Anyone with these symptoms should get emergency medical treatment right away. Do not wait to see if the symptoms will go away. Call your local emergency services (911 in the [place] if you have these symptoms. Do not drive yourself to the hospital. °DIAGNOSIS °If a DVT is suspected, your health care provider will take a full medical history and perform a physical exam. Tests that also may be required include: °· Blood tests, including studies of the clotting properties of the blood. °· Ultrasound to see if you have clots in your legs or lungs. °· X-rays to show the flow of blood when dye is injected into the veins (venogram). °· Studies of your lungs if you have any   chest symptoms. °PREVENTION °· Exercise the legs regularly. Take a brisk 30-minute walk every day. °· Maintain a weight that is appropriate for your height. °· Avoid sitting or lying in bed for long periods of time without moving your legs. °· Women, particularly those over the age of 35 years, should consider the risks and benefits of taking estrogen medicines, including birth control pills. °· Do not smoke, especially if you take estrogen medicines. °· Long-distance travel can increase your risk of DVT. You  should exercise your legs by walking or pumping the muscles every hour. °· Many of the risk factors above relate to situations that exist with hospitalization, either for illness, injury, or elective surgery. Prevention may include medical and nonmedical measures. °¨ Your health care provider will assess you for the need for venous thromboembolism prevention when you are admitted to the hospital. If you are having surgery, your surgeon will assess you the day of or day after surgery. °TREATMENT °Once identified, a DVT can be treated. It can also be prevented in some circumstances. Once you have had a DVT, you may be at increased risk for a DVT in the future. The most common treatment for DVT is blood-thinning (anticoagulant) medicine, which reduces the blood's tendency to clot. Anticoagulants can stop new blood clots from forming and stop old clots from growing. They cannot dissolve existing clots. Your body does this by itself over time. Anticoagulants can be given by mouth, through an IV tube, or by injection. Your health care provider will determine the best program for you. Other medicines or treatments that may be used are: °· Heparin or related medicines (low molecular weight heparin) are often the first treatment for a blood clot. They act quickly. However, they cannot be taken orally and must be given either in shot form or by IV tube. °¨ Heparin can cause a fall in a component of blood that stops bleeding and forms blood clots (platelets). You will be monitored with blood tests to be sure this does not occur. °· Warfarin is an anticoagulant that can be swallowed. It takes a few days to start working, so usually heparin or related medicines are used in combination. Once warfarin is working, heparin is usually stopped. °· Factor Xa inhibitor medicines, such as rivaroxaban and apixaban, also reduce blood clotting. These medicines are taken orally and can often be used without heparin or related  medicines. °· Less commonly, clot dissolving drugs (thrombolytics) are used to dissolve a DVT. They carry a high risk of bleeding, so they are used mainly in severe cases where your life or a part of your body is threatened. °· Very rarely, a blood clot in the leg needs to be removed surgically. °· If you are unable to take anticoagulants, your health care provider may arrange for you to have a filter placed in a main vein in your abdomen. This filter prevents clots from traveling to your lungs. °HOME CARE INSTRUCTIONS °· Take all medicines as directed by your health care provider. °· Learn as much as you can about DVT. °· Wear a medical alert bracelet or carry a medical alert card. °· Ask your health care provider how soon you can go back to normal activities. It is important to stay active to prevent blood clots. If you are on anticoagulant medicine, avoid contact sports. °· It is very important to exercise. This is especially important while traveling, sitting, or standing for long periods of time. Exercise your legs by walking or by   tightening and relaxing your leg muscles regularly. Take frequent walks. °· You may need to wear compression stockings. These are tight elastic stockings that apply pressure to the lower legs. This pressure can help keep the blood in the legs from clotting. °Taking Warfarin °Warfarin is a daily medicine that is taken by mouth. Your health care provider will advise you on the length of treatment (usually 3-6 months, sometimes lifelong). If you take warfarin: °· Understand how to take warfarin and foods that can affect how warfarin works in your body. °· Too much and too little warfarin are both dangerous. Too much warfarin increases the risk of bleeding. Too little warfarin continues to allow the risk for blood clots. °Warfarin and Regular Blood Testing °While taking warfarin, you will need to have regular blood tests to measure your blood clotting time. These blood tests usually  include both the prothrombin time (PT) and international normalized ratio (INR) tests. The PT and INR results allow your health care provider to adjust your dose of warfarin. It is very important that you have your PT and INR tested as often as directed by your health care provider.    °Warfarin and Your Diet °Avoid major changes in your diet, or notify your health care provider before changing your diet. Arrange a visit with a registered dietitian to answer your questions. Many foods, especially foods high in vitamin K, can interfere with warfarin and affect the PT and INR results. You should eat a consistent amount of foods high in vitamin K. Foods high in vitamin K include:  °· Spinach, kale, broccoli, cabbage, collard and turnip greens, Brussels sprouts, peas, cauliflower, seaweed, and parsley. °· Beef and pork liver. °· Green tea. °· Soybean oil. °Warfarin with Other Medicines °Many medicines can interfere with warfarin and affect the PT and INR results. You must: °· Tell your health care provider about any and all medicines, vitamins, and supplements you take, including aspirin and other over-the-counter anti-inflammatory medicines. Be especially cautious with aspirin and anti-inflammatory medicines. Ask your health care provider before taking these. °· Do not take or discontinue any prescribed or over-the-counter medicine except on the advice of your health care provider or pharmacist. °Warfarin Side Effects °Warfarin can have side effects, such as easy bruising and difficulty stopping bleeding. Ask your health care provider or pharmacist about other side effects of warfarin. You will need to: °· Hold pressure over cuts for longer than usual. °· Notify your dentist and other health care providers that you are taking warfarin before you undergo any procedures where bleeding may occur. °Warfarin with Alcohol and Tobacco  °· Drinking alcohol frequently can increase the effect of warfarin, leading to excess  bleeding. It is best to avoid alcoholic drinks or to consume only very small amounts while taking warfarin. Notify your health care provider if you change your alcohol intake.   °· Do not use any tobacco products including cigarettes, chewing tobacco, or electronic cigarettes. If you smoke, quit. Ask your health care provider for help with quitting smoking. °Alternative Medicines to Warfarin: Factor Xa Inhibitor Medicines °· These blood-thinning medicines are taken by mouth, usually for several weeks or longer. It is important to take the medicine every single day at the same time each day. °· There are no regular blood tests required when using these medicines. °· There are fewer food and drug interactions than with warfarin. °· The side effects of this class of medicine are similar to those of warfarin, including excessive bruising or bleeding. Ask your   health care provider or pharmacist about other potential side effects. SEEK MEDICAL CARE IF:  You notice a rapid heartbeat.  You feel weaker or more tired than usual.  You feel faint.  You notice increased bruising.  You feel your symptoms are not getting better in the time expected.  You believe you are having side effects of medicine. SEEK IMMEDIATE MEDICAL CARE IF:  You have chest pain.  You have trouble breathing.  You have new or increased swelling or pain in one leg.  You cough up blood.  You notice blood in vomit, in a bowel movement, or in urine. MAKE SURE YOU:  Understand these instructions.  Will watch your condition.  Will get help right away if you are not doing well or get worse. Document Released: 01/23/2005 Document Revised: 06/09/2013 Document Reviewed: 09/30/2012 [facility] Patient Information 2015 [place], [place]. This information is not intended to replace advice given to you by your health care provider. Make sure you discuss any questions you have with your health care provider.    Take Lovenox as directed and  less facility physician wants to switch it over to a different type of medication. Return for any shortness of breath or chest pain.

## 2014-08-08 NOTE — ED Notes (Signed)
Right hip surgery one week ago, went to rehab center and felt terrible.  Was seen at [place] here, brought over due to "multiple blood clots".  Also c/o shortness of breath but feels due to anxiety.

## 2014-08-08 NOTE — Progress Notes (Signed)
VASCULAR LAB PRELIMINARY  PRELIMINARY  PRELIMINARY  PRELIMINARY  Bilateral lower extremity venous Dopplers completed.    Preliminary report:  There is acute partially occlusive DVT noted in the left common femoral and proximal femoral veins.  There is acute occlusive DVT noted in the left mid to distal femoral, popliteal, peroneal, and posterior tibial veins.  There is acute occlusive DVT noted in the right posterior tibial and peroneal veins.   Lachlan Pelto, RVT 08/08/2014, 8:39 AM

## 2014-10-20 ENCOUNTER — Other Ambulatory Visit: Payer: Self-pay | Admitting: Orthopedic Surgery

## 2014-10-20 ENCOUNTER — Ambulatory Visit (HOSPITAL_COMMUNITY)
Admission: RE | Admit: 2014-10-20 | Discharge: 2014-10-20 | Disposition: A | Discharge status: Home / Self Care | Payer: Medicare Other | Source: Ambulatory Visit | Attending: Cardiology | Admitting: Cardiology

## 2014-10-20 DIAGNOSIS — I82591 Chronic embolism and thrombosis of other specified deep vein of right lower extremity: Secondary | ICD-10-CM | POA: Diagnosis not present

## 2014-10-20 DIAGNOSIS — I10 Essential (primary) hypertension: Secondary | ICD-10-CM | POA: Diagnosis not present

## 2014-10-20 DIAGNOSIS — M7989 Other specified soft tissue disorders: Secondary | ICD-10-CM | POA: Insufficient documentation

## 2014-10-20 DIAGNOSIS — I82411 Acute embolism and thrombosis of right femoral vein: Secondary | ICD-10-CM | POA: Insufficient documentation

## 2014-10-20 DIAGNOSIS — I82531 Chronic embolism and thrombosis of right popliteal vein: Secondary | ICD-10-CM | POA: Insufficient documentation

## 2014-10-20 DIAGNOSIS — M79604 Pain in right leg: Secondary | ICD-10-CM | POA: Insufficient documentation

## 2014-10-20 DIAGNOSIS — E785 Hyperlipidemia, unspecified: Secondary | ICD-10-CM | POA: Insufficient documentation

## 2014-10-20 DIAGNOSIS — E119 Type 2 diabetes mellitus without complications: Secondary | ICD-10-CM | POA: Diagnosis not present

## 2014-10-20 DIAGNOSIS — I82512 Chronic embolism and thrombosis of left femoral vein: Secondary | ICD-10-CM | POA: Diagnosis not present

## 2015-05-18 ENCOUNTER — Other Ambulatory Visit: Payer: Self-pay | Admitting: Geriatric Medicine

## 2015-05-18 DIAGNOSIS — M5441 Lumbago with sciatica, right side: Secondary | ICD-10-CM

## 2015-05-27 ENCOUNTER — Ambulatory Visit
Admission: RE | Admit: 2015-05-27 | Discharge: 2015-05-27 | Disposition: A | Discharge status: Home / Self Care | Payer: Medicare Other | Source: Ambulatory Visit | Attending: Geriatric Medicine | Admitting: Geriatric Medicine

## 2015-05-27 DIAGNOSIS — M5441 Lumbago with sciatica, right side: Secondary | ICD-10-CM

## 2015-06-30 ENCOUNTER — Other Ambulatory Visit: Payer: Self-pay

## 2015-06-30 DIAGNOSIS — Z1231 Encounter for screening mammogram for malignant neoplasm of breast: Secondary | ICD-10-CM

## 2015-07-16 ENCOUNTER — Ambulatory Visit: Payer: Medicare Other

## 2015-07-16 ENCOUNTER — Ambulatory Visit
Admission: RE | Admit: 2015-07-16 | Discharge: 2015-07-16 | Disposition: A | Discharge status: Home / Self Care | Payer: Medicare Other | Source: Ambulatory Visit

## 2015-07-16 ENCOUNTER — Other Ambulatory Visit: Payer: Self-pay | Admitting: Geriatric Medicine

## 2015-07-16 DIAGNOSIS — Z1231 Encounter for screening mammogram for malignant neoplasm of breast: Secondary | ICD-10-CM

## 2016-01-23 ENCOUNTER — Encounter (HOSPITAL_COMMUNITY): Payer: Self-pay

## 2016-01-23 ENCOUNTER — Emergency Department (HOSPITAL_COMMUNITY)
Admission: EM | Admit: 2016-01-23 | Discharge: 2016-01-23 | Disposition: A | Discharge status: Home / Self Care | Payer: Medicare Other | Attending: Emergency Medicine | Admitting: Emergency Medicine

## 2016-01-23 ENCOUNTER — Emergency Department (HOSPITAL_COMMUNITY): Payer: Medicare Other

## 2016-01-23 DIAGNOSIS — E039 Hypothyroidism, unspecified: Secondary | ICD-10-CM | POA: Diagnosis not present

## 2016-01-23 DIAGNOSIS — E119 Type 2 diabetes mellitus without complications: Secondary | ICD-10-CM | POA: Insufficient documentation

## 2016-01-23 DIAGNOSIS — F17200 Nicotine dependence, unspecified, uncomplicated: Secondary | ICD-10-CM | POA: Insufficient documentation

## 2016-01-23 DIAGNOSIS — Z7982 Long term (current) use of aspirin: Secondary | ICD-10-CM | POA: Diagnosis not present

## 2016-01-23 DIAGNOSIS — Z8673 Personal history of transient ischemic attack (TIA), and cerebral infarction without residual deficits: Secondary | ICD-10-CM | POA: Insufficient documentation

## 2016-01-23 DIAGNOSIS — R109 Unspecified abdominal pain: Secondary | ICD-10-CM | POA: Diagnosis present

## 2016-01-23 DIAGNOSIS — Z853 Personal history of malignant neoplasm of breast: Secondary | ICD-10-CM | POA: Insufficient documentation

## 2016-01-23 DIAGNOSIS — K5900 Constipation, unspecified: Secondary | ICD-10-CM | POA: Diagnosis not present

## 2016-01-23 DIAGNOSIS — R1084 Generalized abdominal pain: Secondary | ICD-10-CM

## 2016-01-23 DIAGNOSIS — Z7984 Long term (current) use of oral hypoglycemic drugs: Secondary | ICD-10-CM | POA: Diagnosis not present

## 2016-01-23 LAB — COMPREHENSIVE METABOLIC PANEL
ALBUMIN: 4.3 g/dL (ref 3.5–5.0)
ALT: 24 U/L (ref 14–54)
AST: 23 U/L (ref 15–41)
Alkaline Phosphatase: 58 U/L (ref 38–126)
Anion gap: 10 (ref 5–15)
BUN: 26 mg/dL — AB (ref 6–20)
CHLORIDE: 102 mmol/L (ref 101–111)
CO2: 27 mmol/L (ref 22–32)
CREATININE: 0.66 mg/dL (ref 0.44–1.00)
Calcium: 9.6 mg/dL (ref 8.9–10.3)
GFR calc Af Amer: >60 mL/min (ref >60)
GLUCOSE: 189 mg/dL — AB (ref 65–99)
Potassium: 3.9 mmol/L (ref 3.5–5.1)
SODIUM: 139 mmol/L (ref 135–145)
Total Bilirubin: 0.4 mg/dL (ref 0.3–1.2)
Total Protein: 6.6 g/dL (ref 6.5–8.1)

## 2016-01-23 LAB — CBC
HEMATOCRIT: 40.3 % (ref 36.0–46.0)
Hemoglobin: 13.6 g/dL (ref 12.0–15.0)
MCH: 31.4 pg (ref 26.0–34.0)
MCHC: 33.7 g/dL (ref 30.0–36.0)
MCV: 93.1 fL (ref 78.0–100.0)
Platelets: 183 10*3/uL (ref 150–400)
RBC: 4.33 MIL/uL (ref 3.87–5.11)
RDW: 13.1 % (ref 11.5–15.5)
WBC: 6.9 10*3/uL (ref 4.0–10.5)

## 2016-01-23 LAB — URINALYSIS, ROUTINE W REFLEX MICROSCOPIC
Bilirubin Urine: NEGATIVE
GLUCOSE, UA: NEGATIVE mg/dL
HGB URINE DIPSTICK: NEGATIVE
Ketones, ur: NEGATIVE mg/dL
Leukocytes, UA: NEGATIVE
Nitrite: NEGATIVE
PH: 5 (ref 5.0–8.0)
Protein, ur: NEGATIVE mg/dL
Specific Gravity, Urine: >1.046 — ABNORMAL HIGH (ref 1.005–1.030)

## 2016-01-23 LAB — LIPASE, BLOOD: LIPASE: 43 U/L (ref 11–51)

## 2016-01-23 MED ORDER — IOPAMIDOL (ISOVUE-300) INJECTION 61%
INTRAVENOUS | Status: AC
  Administered 2016-01-23: 100 mL
  Filled 2016-01-23: qty 100

## 2016-01-23 MED ORDER — DOCUSATE SODIUM 100 MG PO CAPS: 100.0000 mg | ORAL_CAPSULE | Freq: Two times a day (BID) | ORAL | 0 refills | Status: DC

## 2016-01-23 MED ORDER — SODIUM CHLORIDE 0.9 % IJ SOLN
INTRAMUSCULAR | Status: AC
  Filled 2016-01-23: qty 50

## 2016-01-23 NOTE — ED Notes (Signed)
Patient ambulated to restroom with cane, but no other assistance. Patient had a "hat" in commode to catch sample. Was not lined up with patient. Collected about 1 cc of urine. PA Tatyana aware.

## 2016-01-23 NOTE — ED Provider Notes (Signed)
[facility] DEPT Provider Note   CSN: [id number] Arrival date & time: 01/23/16  1704     History   Chief Complaint Chief Complaint  Patient presents with  . Abdominal Pain    HPI [name] is a 81 y.o. female.  HPI [name] is a 81 y.o. female presents to ED with complaint of abdominal discomfort and bloating. History mostly provided by family member. Pt lives in a retirement community. Hx of dementia and aphasia. Over the last month, family noted abdominal bloating. Patient has also had some difficulty having a bowel movement. Initially went to urgent care, had an x-ray done, was told she had "gas pockets." She was put on the gas medication. She then followed up with her primary care doctor who was concerned about possible constipation and started her on MiraLAX daily. Patient's symptoms continued to worsen, and today she is complaining of abdominal pain. She denies any nausea or vomiting. She is unsure when her last bowel movement was. No diarrhea. No urinary symptoms. No fever or chills. Normal appetite.  Past Medical History:  Diagnosis Date  . Aphasia   . Cancer   . Diabetes mellitus   . Stroke   . Tobacco abuse     Patient Active Problem List   Diagnosis Date Noted  . Hip fx ([place]) 07/26/2014  . Knee effusion   . Hip fracture, right ([place]) 07/25/2014  . Hip fracture ([place]) 07/25/2014  . Tobacco abuse   . Aphasia   . Fall   . Intertrochanteric fracture of right hip ([place])   . Multiple pelvic fractures ([place]) 01/22/2011  . Hypothyroidism 09/15/2009  . Diabetes mellitus without complication ([place]) [id number]  . Essential hypertension 09/15/2009  . Osteoporosis 09/15/2009  . HEEL PAIN, LEFT 09/14/2009  . Hx Breast cancer, IDC, Stage II, right, receptor +, her 2 neg. 03/26/2003    Past Surgical History:  Procedure Laterality Date  . BREAST SURGERY    . INTRAMEDULLARY (IM) NAIL INTERTROCHANTERIC Right 07/26/2014   Procedure: INTRAMEDULLARY (IM) NAIL  INTERTROCHANTRIC;  Surgeon: Susa Day, MD;  Location: [facility];  Service: Orthopedics;  Laterality: Right;    OB History    No data available       Home Medications    Prior to Admission medications   Medication Sig Start Date End Date Taking? Authorizing Provider  aspirin EC 325 MG tablet Take 1 tablet (325 mg total) by mouth 2 (two) times daily after a meal. 07/26/14   Susa Day, MD  atorvastatin (LIPITOR) 40 MG tablet Take 40 mg by mouth daily.      Historical Provider, MD  bacitracin ointment Apply topically 2 (two) times daily. To abrasions Patient not taking: Reported on 08/07/2014 07/28/14   Eugenie Filler, MD  busPIRone (BUSPAR) 5 MG tablet Take 5 mg by mouth 2 (two) times daily.    Historical Provider, MD  Calcium Carbonate-Vitamin D (CALTRATE 600+D PO) Take 1 tablet by mouth 2 (two) times daily.    Historical Provider, MD  denosumab (PROLIA) 60 MG/ML SOLN injection Inject 60 mg into the skin every 6 (six) months. Administer in upper arm, thigh, or abdomen    Historical Provider, MD  docusate sodium (COLACE) 100 MG capsule Take 1 capsule (100 mg total) by mouth 2 (two) times daily as needed for mild constipation. Patient not taking: Reported on 08/07/2014 07/26/14   Susa Day, MD  docusate sodium (COLACE) 100 MG capsule Take 1 capsule (100 mg total) by mouth 2 (two) times daily.  07/28/14   Eugenie Filler, MD  donepezil (ARICEPT) 10 MG tablet Take 10 mg by mouth at bedtime. 07/13/14   Historical Provider, MD  enoxaparin (LOVENOX) 100 MG/ML injection Inject 0.7 mLs (70 mg total) into the skin every 12 (twelve) hours. 08/08/14   Fredia Sorrow, MD  glipiZIDE (GLUCOTROL) 5 MG tablet Take 10 mg by mouth 2 (two) times daily. 05/14/14   Historical Provider, MD  HYDROcodone-acetaminophen (NORCO/VICODIN) 5-325 MG per tablet Take 1 tablet by mouth every 4 (four) hours as needed. Patient taking differently: Take 1 tablet by mouth every 4 (four) hours as needed for moderate pain or severe  pain.  07/26/14   Susa Day, MD  LEVEMIR FLEXTOUCH 100 UNIT/ML Pen Inject 14 Units as directed at bedtime. 07/01/14   Historical Provider, MD  levothyroxine (SYNTHROID, LEVOTHROID) 137 MCG tablet Take 1 tablet (137 mcg total) by mouth daily. 07/28/14   Eugenie Filler, MD  magnesium gluconate (MAGONATE) 500 MG tablet Take 1,000 mg by mouth daily.     Historical Provider, MD  metFORMIN (GLUCOPHAGE) 1000 MG tablet Take 1,000 mg by mouth 2 (two) times daily. 06/17/14   Historical Provider, MD  Multiple Vitamin (MULITIVITAMIN WITH MINERALS) TABS Take 1 tablet by mouth daily.      Historical Provider, MD  traMADol (ULTRAM) 50 MG tablet Take 1 tablet (50 mg total) by mouth every 6 (six) hours as needed for moderate pain. 07/28/14   Eugenie Filler, MD    Family History Family History  Problem Relation Age of Onset  . Dementia Mother   . Congestive Heart Failure Father     Social History Social History  Substance Use Topics  . Smoking status: Current Some Day Smoker  . Smokeless tobacco: Never Used  . Alcohol use No     Allergies   Erythromycin and Fosamax [alendronate sodium]   Review of Systems Review of Systems  Constitutional: Negative for appetite change, chills and fever.  Respiratory: Negative for cough, chest tightness and shortness of breath.   Cardiovascular: Negative for chest pain, palpitations and leg swelling.  Gastrointestinal: Positive for abdominal pain and constipation. Negative for diarrhea, nausea and vomiting.  Genitourinary: Negative for dysuria, flank pain and pelvic pain.  Musculoskeletal: Negative for arthralgias, myalgias, neck pain and neck stiffness.  Skin: Negative for rash.  Neurological: Negative for dizziness, weakness and headaches.  All other systems reviewed and are negative.    Physical Exam Updated Vital Signs BP 145/71 (BP Location: Left Arm)   Pulse 81   Temp 97.9 F (36.6 C) (Oral)   Resp 18   SpO2 95%   Physical Exam    Constitutional: She appears well-developed and well-nourished. No distress.  HENT:  Head: Normocephalic.  Eyes: Conjunctivae are normal.  Neck: Neck supple.  Cardiovascular: Normal rate, regular rhythm and normal heart sounds.   Pulmonary/Chest: Effort normal and breath sounds normal. No respiratory distress. She has no wheezes. She has no rales.  Abdominal: Soft. Bowel sounds are normal. She exhibits no distension. There is no tenderness. There is no rebound and no guarding.  Musculoskeletal: She exhibits no edema.  Neurological: She is alert.  Skin: Skin is warm and dry.  Psychiatric: She has a normal mood and affect. Her behavior is normal.  Nursing note and vitals reviewed.    ED Treatments / Results  Labs (all labs ordered are listed, but only abnormal results are displayed) Labs Reviewed  COMPREHENSIVE METABOLIC PANEL - Abnormal; Notable for the following:  Result Value   Glucose, Bld 189 (*)    BUN 26 (*)    All other components within normal limits  URINALYSIS, ROUTINE W REFLEX MICROSCOPIC - Abnormal; Notable for the following:    Specific Gravity, Urine >1.046 (*)    All other components within normal limits  LIPASE, BLOOD  CBC    EKG  EKG Interpretation None       Radiology Ct Abdomen Pelvis W Contrast  Result Date: 01/23/2016 CLINICAL DATA:  Abdominal pain and distention.  Constipation. EXAM: CT ABDOMEN AND PELVIS WITH CONTRAST TECHNIQUE: Multidetector CT imaging of the abdomen and pelvis was performed using the standard protocol following bolus administration of intravenous contrast. CONTRAST:  129mL ISOVUE-300 IOPAMIDOL (ISOVUE-300) INJECTION 61% COMPARISON:  04/23/2003 FINDINGS: The study is mildly motion degraded. Lower chest: Chronic scarring is present in the lung bases. There is no pleural effusion. Hepatobiliary: Chronic hepatomegaly versus Riedel's lobe configuration. Prior cholecystectomy without significant biliary dilatation. Pancreas:  Unremarkable. Spleen: The spleen has overall decreased in volume since 2005, and there is a small volume of hypoattenuating tissue/material in the peripheral spleen or subcapsular space posteriorly and superiorly. Evaluation is limited by motion artifact. No surrounding perisplenic inflammation or free fluid. Adrenals/Urinary Tract: Unremarkable adrenal glands. No evidence of renal mass, calculi, or hydronephrosis. Unremarkable bladder. Stomach/Bowel: The stomach is within normal limits. There is no evidence of bowel obstruction or wall thickening. Scattered colonic diverticulosis is noted without evidence of diverticulitis. The appendix is unremarkable. A small to moderate amount of stool is present in the colon. Vascular/Lymphatic: Abdominal aortic atherosclerosis without aneurysm. No enlarged lymph nodes. Reproductive: Small calcified uterine fibroids.  No adnexal mass. Other: No intraperitoneal free fluid. No abdominal wall mass or hernia. Musculoskeletal: Old right lateral eighth rib fracture. Old fractures of the sacrum, right superior and inferior pubic rami, and right femur. Prior internal fixation of the femur fracture. Chronic L3 superior endplate Schmorl's node deformity (considering the transitional segment as a sacralized L5). IMPRESSION: 1. No definite acute abnormality identified in the abdomen or pelvis. 2. Interval splenic volume loss from 2005 with small amount of hypoattenuating material peripherally along the posterior spleen which may reflect a small chronic subcapsular hematoma or sequelae of prior infarcts. 3. Aortic atherosclerosis. Electronically Signed   By: Logan Bores M.D.   On: 01/23/2016 20:39   Dg Abd 2 Views  Result Date: 01/23/2016 CLINICAL DATA:  Pt c/o abd pain and abd distension. Pt denies n/v or diarrhea. Pt c/o constipation x3 days. Pt has aphasia, family states pt has irregular BM's, chronic abd issues. EXAM: ABDOMEN - 2 VIEW COMPARISON:  12/12/2015 FINDINGS: Bowel gas  pattern is nonobstructed. There is significant stool throughout nondilated loops of colon. No free intraperitoneal air. Status post right hip ORIF. Superior endplate fracture of L4. Remote fracture of the right inferior pubic ramus. IMPRESSION: 1.  No evidence for acute  abnormality. 2. Moderate stool burden. Electronically Signed   By: Nolon Nations M.D.   On: 01/23/2016 18:05    Procedures Procedures (including critical care time)  Medications Ordered in ED Medications - No data to display   Initial Impression / Assessment and Plan / ED Course  I have reviewed the triage vital signs and the nursing notes.  Pertinent labs & imaging results that were available during my care of the patient were reviewed by me and considered in my medical decision making (see chart for details).  Clinical Course    Pt with bloating, constipation, abdominal discomfort for  a month, worsened today. Pt in NAD. Abdomen is soft, non tender, BS present. Unsure of last BM. Will check labs, xray, ua.   X-ray showing moderate stool. Lab work unremarkable. Urinalysis pending. Discussed with Dr. Ralene Bathe, will get CT fur further elvaluation.   CT negative other than chronic splenic changes. Pt in NAD. Abdomen benign at this time. Suspect constipation. Will add colace to miralax. Follow up with family doctor. Return precuations discussed.   Vitals:   01/23/16 1722 01/23/16 1835 01/23/16 2123  BP: 145/71  131/57  Pulse: 81  89  Resp: 18  20  Temp: 97.9 F (36.6 C)  97.8 F (36.6 C)  TempSrc: Oral  Oral  SpO2: 95%  95%  Weight:  72.6 kg   Height:  5\' 7"  (1.702 m)      Final Clinical Impressions(s) / ED Diagnoses   Final diagnoses:  Abdominal pain  Generalized abdominal pain  Constipation, unspecified constipation type    New Prescriptions Discharge Medication List as of 01/23/2016  9:13 PM    START taking these medications   Details  !! docusate sodium (COLACE) 100 MG capsule Take 1 capsule (100 mg  total) by mouth every 12 (twelve) hours., Starting Sun 01/23/2016, Print     !! - Potential duplicate medications found. Please discuss with provider.       Jeannett Senior, PA-C 01/23/16 2307    Quintella Reichert, MD 01/24/16 [phone]

## 2016-01-23 NOTE — ED Notes (Signed)
Patient aware we need urine, unable to provide specimen at this time. 

## 2016-01-23 NOTE — ED Triage Notes (Signed)
BIB EMS from [facility] w/ c/o abd pain and abd distension. Pt denies n/v or diarrhea. Pt c/o constipation x3 days.   EMS Vitals BP 163/88 P 80 RR 16 SPO2 96% RA  BSG 191

## 2016-01-23 NOTE — ED Notes (Signed)
I stuck patient x2 to attempt to obtain blood, unsuccessful x2

## 2016-01-23 NOTE — ED Notes (Signed)
Bed: WA08 Expected date:  Expected time:  Means of arrival:  Comments: 81 yo constipation

## 2016-01-23 NOTE — Discharge Instructions (Signed)
Your CT scan and your urinalysis are normal. Drink plenty of fluids. Continue MiraLAX. Take Colace. Follow-up with family doctor for recheck. Return if any worsening symptoms.

## 2016-08-04 ENCOUNTER — Encounter: Payer: Self-pay | Admitting: Neurology

## 2016-09-25 ENCOUNTER — Ambulatory Visit (INDEPENDENT_AMBULATORY_CARE_PROVIDER_SITE_OTHER): Payer: Medicare Other | Admitting: Neurology

## 2016-09-25 ENCOUNTER — Encounter: Payer: Self-pay | Admitting: Neurology

## 2016-09-25 VITALS — BP 110/62 | HR 74 | Ht 66.0 in | Wt 162.0 lb

## 2016-09-25 DIAGNOSIS — F0390 Unspecified dementia without behavioral disturbance: Secondary | ICD-10-CM

## 2016-09-25 DIAGNOSIS — F03C0 Unspecified dementia, severe, without behavioral disturbance, psychotic disturbance, mood disturbance, and anxiety: Secondary | ICD-10-CM

## 2016-09-25 NOTE — Progress Notes (Signed)
NEUROLOGY CONSULTATION NOTE  Karl Knarr MRN: [id number] DOB: 03/16/1934  Referring provider: Dr. Lajean Manes Primary care provider: Dr. Lajean Manes  Reason for consult:  Second opinion for Alzheimer's dementia  Dear Dr Felipa Eth:  Thank you for your kind referral of Annick Dimaio for consultation of the above symptoms. Although her history is well known to you, please allow me to reiterate it for the purpose of our medical record. The patient was accompanied to the clinic by her  who also provides collateral information. Records and images were personally reviewed where available.  HISTORY OF PRESENT ILLNESS: This is a pleasant 82 year old right-handed woman with a history of hyperlipidemia, diabetes, hypothyroidism, DVT on anticoagulation with Eliquis, dementia, presenting for second opinion. She is accompanied by her long-time friend Santiago Glad, who lives with her, providing majority of the history. Santiago Glad reports that her 2 brothers asked for neurological consultation to feel more comfortable that they have covered all the bases and are giving her the right treatment. She feels her memory is doing well, "I reach for information for people that I know, and I thinks that's about it." She lives with her friend who has known her since the 52s. Santiago Glad started noticing changes around 7 years ago when she was not able to logically work the temperature settings in their house, she had difficulties solving problems. Around 4-5 years ago, after coming back from [place], she was making up stories that were not real. Santiago Glad feels the patient was very aware that something was going on, because she made the good decision to move into a retirement home. She had difficulties with memory while they were doing the move. She has not regretted moving to [place] 3 years ago. Santiago Glad has been helping her with medications for the past 2.5 years since she fractured her leg. Around that time, they were getting  reminders about unpaid bills, she asked her brother to start taking over finances. She is unable to recall where her brother lives (he lives in [place]). She stopped riving 2 years ago, she was getting lost driving. She is noted to have paraphasic errors and at times word-finding difficulties, she denies any problems getting her words out. She is a retired professor of Sport and exercise psychologist at [employer] and could not say what she used to do, needing prompting by Santiago Glad. When asked what age she retired, she answered "that." When asked if she still cooks, she reports "I don't have any problem copping." Notes from Dr. Felipa Eth indicate an MMSE to 34/30 in May 2013, 18/30 in October 2014, 5/30 in May 2017. She is taking Namzaric with no side effects.  Santiago Glad denies any visual or auditory hallucinations, but she appears to have delusions thinking that someone is there or someone is coming, asking where they were. No paranoia or significant personality changes. There was one time when Santiago Glad heard her bedroom door open, she was very concerned about being responsible for the people who were there, she felt she failed them. Santiago Glad tried hard to calm her down and explain it was not a reality, she gave [place] her toy dog which gave her much comfort. Otherwise Santiago Glad denies any paranoia, she is "pretty calm." She does pretty well with dressing and bathing, house is well maintained. Santiago Glad reports a few instances where she would be very difficult to arouse from sleep. She has a little neck and back pain, cramps in her left calf, numbness and tingling in her left foot. She denies any significant  headaches, dizziness, diplopia, dysarthria/dysphagia, bowel/bladder dysfunction, anosmia, or tremors. Her mother had Alzheimer's disease, her brother "may be following with some signs of memory changes." No history of significant head injuries, no alcohol use.   Laboratory Data: Vitamin B12 08/02/16: 409, TSH 06/14/16 0.70 MRI brain with and  without contrast 12/12/2012: Generalized atrophy, appropriate for age, small white matter hyperintensities bilaterally, no acute infarct, no abnormal enhancement.  PAST MEDICAL HISTORY: Past Medical History:  Diagnosis Date  . Aphasia   . Cancer ([place])   . Diabetes mellitus   . Tobacco abuse     PAST SURGICAL HISTORY: Past Surgical History:  Procedure Laterality Date  . BREAST SURGERY    . FEMUR FRACTURE SURGERY    . INTRAMEDULLARY (IM) NAIL INTERTROCHANTERIC Right 07/26/2014   Procedure: INTRAMEDULLARY (IM) NAIL INTERTROCHANTRIC;  Surgeon: Susa Day, MD;  Location: [facility];  Service: Orthopedics;  Laterality: Right;    MEDICATIONS: Current Outpatient Prescriptions on File Prior to Visit  Medication Sig Dispense Refill  . apixaban (ELIQUIS) 5 MG TABS tablet Take 5 mg by mouth 2 (two) times daily.    Marland Kitchen aspirin EC 325 MG tablet Take 1 tablet (325 mg total) by mouth 2 (two) times daily after a meal. (Patient taking differently: Take 81 mg by mouth daily. ) 30 tablet 0  . atorvastatin (LIPITOR) 40 MG tablet Take 40 mg by mouth daily.      . bacitracin ointment Apply topically 2 (two) times daily. To abrasions 120 g 0  . Calcium Carbonate-Vitamin D (CALTRATE 600+D PO) Take 1 tablet by mouth 2 (two) times daily.    Marland Kitchen enoxaparin (LOVENOX) 100 MG/ML injection Inject 0.7 mLs (70 mg total) into the skin every 12 (twelve) hours. 5 Syringe 0  . glipiZIDE (GLUCOTROL) 5 MG tablet Take 10 mg by mouth 2 (two) times daily.  0  . LEVEMIR FLEXTOUCH 100 UNIT/ML Pen Inject 14 Units as directed at bedtime.  9  . levothyroxine (SYNTHROID, LEVOTHROID) 137 MCG tablet Take 1 tablet (137 mcg total) by mouth daily. 30 tablet 0  . magnesium gluconate (MAGONATE) 500 MG tablet Take 1,000 mg by mouth daily.     . metFORMIN (GLUCOPHAGE) 1000 MG tablet Take 1,000 mg by mouth 2 (two) times daily.  2  . Multiple Vitamin (MULITIVITAMIN WITH MINERALS) TABS Take 1 tablet by mouth daily.      Marland Kitchen NAMZARIC 28-10 MG CP24  Take 1 capsule by mouth daily.  3  . traMADol (ULTRAM) 50 MG tablet Take 1 tablet (50 mg total) by mouth every 6 (six) hours as needed for moderate pain. (Patient not taking: Reported on 01/23/2016) 20 tablet 0   No current facility-administered medications on file prior to visit.     ALLERGIES: Allergies  Allergen Reactions  . Erythromycin Hives  . Fosamax [Alendronate Sodium] Other (See Comments)    Reaction Unknown    FAMILY HISTORY: Family History  Problem Relation Age of Onset  . Dementia Mother   . Congestive Heart Failure Father     SOCIAL HISTORY: Social History   Social History  . Marital status: Single    Spouse name: N/A  . Number of children: N/A  . Years of education: N/A   Occupational History  . Not on file.   Social History Main Topics  . Smoking status: Current Some Day Smoker  . Smokeless tobacco: Never Used  . Alcohol use No  . Drug use: No  . Sexual activity: Not on file   Other Topics  Concern  . Not on file   Social History Narrative  . No narrative on file    REVIEW OF SYSTEMS: Constitutional: No fevers, chills, or sweats, no generalized fatigue, change in appetite Eyes: No visual changes, double vision, eye pain Ear, nose and throat: No hearing loss, ear pain, nasal congestion, sore throat Cardiovascular: No chest pain, palpitations Respiratory:  No shortness of breath at rest or with exertion, wheezes GastrointestinaI: No nausea, vomiting, diarrhea, abdominal pain, fecal incontinence Genitourinary:  No dysuria, urinary retention or frequency Musculoskeletal:  + occasional neck pain, back pain Integumentary: No rash, pruritus, skin lesions Neurological: as above Psychiatric: No depression, insomnia, anxiety Endocrine: No palpitations, fatigue, diaphoresis, mood swings, change in appetite, change in weight, increased thirst Hematologic/Lymphatic:  No anemia, purpura, petechiae. Allergic/Immunologic: no itchy/runny eyes, nasal  congestion, recent allergic reactions, rashes  PHYSICAL EXAM: Vitals:   09/25/16 1358  BP: 110/62  Pulse: 74  SpO2: 93%   General: No acute distress Head:  Normocephalic/atraumatic Eyes: Fundoscopic exam shows bilateral sharp discs, no vessel changes, exudates, or hemorrhages Neck: supple, no paraspinal tenderness, full range of motion Back: No paraspinal tenderness Heart: regular rate and rhythm Lungs: Clear to auscultation bilaterally. Vascular: No carotid bruits. Skin/Extremities: No rash, no edema Neurological Exam: Mental status: alert and oriented to person, does not know her birthday. When given choices, she states year is 2008. She is able to answer some questions, but needs prompting with others. She makes paraphasic errors. She can repeat 1-2 words but has difficulty repeating longer phrases. She can only name 1 word starting with F, and keeps repeating the same word ("funny"). She cannot name lion, rhino, or camel.  Montreal Cognitive Assessment  09/25/2016  Visuospatial/ Executive (0/5) 1  Naming (0/3) 0  Attention: Read list of digits (0/2) 1  Attention: Read list of letters (0/1) 0  Attention: Serial 7 subtraction starting at 100 (0/3) 0  Language: Repeat phrase (0/2) 0  Language : Fluency (0/1) 0  Abstraction (0/2) 1  Delayed Recall (0/5) 0  Orientation (0/6) 0  Total 3   Cranial nerves: CN I: not tested CN II: pupils equal, round and reactive to light, visual fields intact, fundi unremarkable. CN III, IV, VI:  full range of motion, no nystagmus, no ptosis CN V: facial sensation intact CN VII: upper and lower face symmetric CN VIII: hearing intact to finger rub CN IX, X: gag intact, uvula midline CN XI: sternocleidomastoid and trapezius muscles intact CN XII: tongue midline Bulk & Tone: normal, no fasciculations. Motor: 5/5 throughout with no pronator drift. Sensation: intact to light touch, cold, pin, vibration and joint position sense.  No extinction to  double simultaneous stimulation.  Romberg test negative Deep Tendon Reflexes: +2 throughout, no ankle clonus Plantar responses: downgoing bilaterally Cerebellar: no incoordination on finger to nose, heel to shin. No dysdiadochokinesia Gait: slow and cautious with cane, no ataxia Tremor: none  IMPRESSION: This is a pleasant 82 year old right-handed woman with a history of  hyperlipidemia, diabetes, hypothyroidism, DVT on anticoagulation with Eliquis, dementia, presenting for second opinion of dementia. Her exam shows a MOCA score 3/30, indicating severe dementia, with principal features mostly involving expressive and some receptive aphasia. Although Primary Progressive Aphasia is a consideration, review of history shows gradually progressive changes since at least 2013, progression of Alzheimer's disease with limited spoken language is also a possibility. She is still able to attend to personal hygiene. MRI brain in 2014 showed global atrophy. We discussed the diagnosis  of dementia, management with Namzaric, which she is already taking, as well as the importance of home safety and future planning as symptoms do worsen over time. She is currently living with her friend, and may need transition to [facility] in the future. No significant behavioral issues at this time. Her family is mostly wanting to make sure they are doing everything for her, Santiago Glad was reassured about this. Speech therapy may provide some benefit, they will let us know if order is needed at their facility. We discussed the importance of control of vascular risk factors, physical exercise, and brain stimulation exercises for brain health. She will follow-up in 1 year and knows to call for any changes.   Thank you for allowing me to participate in the care of this patient. Please do not hesitate to call for any questions or concerns.   Ellouise Newer, M.D.  CC: Dr. Felipa Eth

## 2016-09-25 NOTE — Patient Instructions (Signed)
1. Continue Namzaric 2. Control of blood pressure, cholesterol, as well as physical exercise (walking 30 minutes three times a week, minimum) and brain stimulation exercises (reading aloud, crossword puzzles, etc) are important for brain health 3. Discuss speech therapy at your facility, let us know if you need an order 4. Follow-up in 1 year, call for any changes

## 2016-09-27 ENCOUNTER — Encounter: Payer: Self-pay | Admitting: Neurology

## 2016-09-27 DIAGNOSIS — F0390 Unspecified dementia without behavioral disturbance: Secondary | ICD-10-CM | POA: Insufficient documentation

## 2016-09-27 DIAGNOSIS — F03C0 Unspecified dementia, severe, without behavioral disturbance, psychotic disturbance, mood disturbance, and anxiety: Secondary | ICD-10-CM | POA: Insufficient documentation

## 2016-11-10 ENCOUNTER — Other Ambulatory Visit: Payer: Self-pay | Admitting: Nurse Practitioner

## 2016-11-10 DIAGNOSIS — N644 Mastodynia: Secondary | ICD-10-CM

## 2016-11-15 ENCOUNTER — Other Ambulatory Visit: Payer: Self-pay | Admitting: Nurse Practitioner

## 2016-11-15 DIAGNOSIS — N644 Mastodynia: Secondary | ICD-10-CM

## 2016-11-16 ENCOUNTER — Other Ambulatory Visit: Payer: Medicare Other

## 2016-11-22 ENCOUNTER — Ambulatory Visit: Payer: Medicare Other

## 2016-11-22 ENCOUNTER — Ambulatory Visit
Admission: RE | Admit: 2016-11-22 | Discharge: 2016-11-22 | Disposition: A | Discharge status: Home / Self Care | Payer: Medicare Other | Source: Ambulatory Visit | Attending: Nurse Practitioner | Admitting: Nurse Practitioner

## 2016-11-22 DIAGNOSIS — N644 Mastodynia: Secondary | ICD-10-CM

## 2016-11-22 HISTORY — DX: Personal history of irradiation: Z92.3

## 2017-06-19 ENCOUNTER — Emergency Department (HOSPITAL_COMMUNITY): Payer: Medicare Other

## 2017-06-19 ENCOUNTER — Emergency Department (HOSPITAL_COMMUNITY)
Admission: EM | Admit: 2017-06-19 | Discharge: 2017-06-19 | Disposition: A | Discharge status: Home / Self Care | Payer: Medicare Other | Attending: Emergency Medicine | Admitting: Emergency Medicine

## 2017-06-19 DIAGNOSIS — Y929 Unspecified place or not applicable: Secondary | ICD-10-CM | POA: Insufficient documentation

## 2017-06-19 DIAGNOSIS — W19XXXA Unspecified fall, initial encounter: Secondary | ICD-10-CM

## 2017-06-19 DIAGNOSIS — Y999 Unspecified external cause status: Secondary | ICD-10-CM | POA: Diagnosis not present

## 2017-06-19 DIAGNOSIS — S01511A Laceration without foreign body of lip, initial encounter: Secondary | ICD-10-CM

## 2017-06-19 DIAGNOSIS — S0240CA Maxillary fracture, right side, initial encounter for closed fracture: Secondary | ICD-10-CM | POA: Diagnosis not present

## 2017-06-19 DIAGNOSIS — S52592A Other fractures of lower end of left radius, initial encounter for closed fracture: Secondary | ICD-10-CM | POA: Diagnosis not present

## 2017-06-19 DIAGNOSIS — K08539 Fractured dental restorative material, unspecified: Secondary | ICD-10-CM | POA: Insufficient documentation

## 2017-06-19 DIAGNOSIS — E119 Type 2 diabetes mellitus without complications: Secondary | ICD-10-CM | POA: Diagnosis not present

## 2017-06-19 DIAGNOSIS — W010XXA Fall on same level from slipping, tripping and stumbling without subsequent striking against object, initial encounter: Secondary | ICD-10-CM | POA: Diagnosis not present

## 2017-06-19 DIAGNOSIS — Y939 Activity, unspecified: Secondary | ICD-10-CM | POA: Insufficient documentation

## 2017-06-19 DIAGNOSIS — Z79899 Other long term (current) drug therapy: Secondary | ICD-10-CM | POA: Insufficient documentation

## 2017-06-19 DIAGNOSIS — Z7984 Long term (current) use of oral hypoglycemic drugs: Secondary | ICD-10-CM | POA: Insufficient documentation

## 2017-06-19 DIAGNOSIS — S0081XA Abrasion of other part of head, initial encounter: Secondary | ICD-10-CM

## 2017-06-19 DIAGNOSIS — F17210 Nicotine dependence, cigarettes, uncomplicated: Secondary | ICD-10-CM | POA: Diagnosis not present

## 2017-06-19 DIAGNOSIS — Z7901 Long term (current) use of anticoagulants: Secondary | ICD-10-CM | POA: Diagnosis not present

## 2017-06-19 DIAGNOSIS — Z7982 Long term (current) use of aspirin: Secondary | ICD-10-CM | POA: Insufficient documentation

## 2017-06-19 DIAGNOSIS — Z859 Personal history of malignant neoplasm, unspecified: Secondary | ICD-10-CM | POA: Diagnosis not present

## 2017-06-19 DIAGNOSIS — S01512A Laceration without foreign body of oral cavity, initial encounter: Secondary | ICD-10-CM | POA: Insufficient documentation

## 2017-06-19 DIAGNOSIS — M2763 Post-osseointegration mechanical failure of dental implant: Secondary | ICD-10-CM

## 2017-06-19 DIAGNOSIS — S0990XA Unspecified injury of head, initial encounter: Secondary | ICD-10-CM | POA: Diagnosis present

## 2017-06-19 DIAGNOSIS — S62102A Fracture of unspecified carpal bone, left wrist, initial encounter for closed fracture: Secondary | ICD-10-CM

## 2017-06-19 DIAGNOSIS — R52 Pain, unspecified: Secondary | ICD-10-CM

## 2017-06-19 MED ORDER — LIDOCAINE HCL (PF) 1 % IJ SOLN
30.0000 mL | Freq: Once | INTRAMUSCULAR | Status: AC
Start: 2017-06-19 — End: 2017-06-19
  Administered 2017-06-19: 30 mL
  Filled 2017-06-19: qty 30

## 2017-06-19 MED ORDER — FENTANYL CITRATE (PF) 100 MCG/2ML IJ SOLN: 25.0000 ug | Freq: Once | INTRAMUSCULAR | Status: DC

## 2017-06-19 MED ORDER — FENTANYL CITRATE (PF) 100 MCG/2ML IJ SOLN
25.0000 ug | Freq: Once | INTRAMUSCULAR | Status: AC
  Administered 2017-06-19: 25 ug via INTRAVENOUS
  Filled 2017-06-19: qty 2

## 2017-06-19 NOTE — NC FL2 (Signed)
[place] LEVEL OF CARE SCREENING TOOL     IDENTIFICATION  Patient Name: [name] Birthdate: 11/09/1934 Sex: female Admission Date (Current Location): 06/19/2017  [facility] and [place] Number:  Herbalist and Address:  [facility], [place] [address] [place], [place] [address]      Provider Number: [id number]  Attending Physician Name and Address:  Carmin Muskrat, MD  Relative Name and Phone Number:       Current Level of Care: Hospital Recommended Level of Care: [place] Prior Approval Number:    Date Approved/Denied:   PASRR Number:   [id number]   Discharge Plan:  SNF/Memory Care    Current Diagnoses: Patient Active Problem List   Diagnosis Date Noted  . Severe dementia 09/27/2016  . Hip fx ([place]) 07/26/2014  . Knee effusion   . Hip fracture, right ([place]) 07/25/2014  . Hip fracture ([place]) 07/25/2014  . Tobacco abuse   . Aphasia   . Fall   . Intertrochanteric fracture of right hip ([place])   . Multiple pelvic fractures 01/22/2011  . Hypothyroidism 09/15/2009  . Diabetes mellitus without complication ([place]) 33/82/5053  . Essential hypertension 09/15/2009  . Osteoporosis 09/15/2009  . HEEL PAIN, LEFT 09/14/2009  . Hx Breast cancer, IDC, Stage II, right, receptor +, her 2 neg. 03/26/2003    Orientation RESPIRATION BLADDER Height & Weight     (pt has a history of dementia at baseline. )  Normal Continent Weight:   Height:     BEHAVIORAL SYMPTOMS/MOOD NEUROLOGICAL BOWEL NUTRITION STATUS      Continent Diet(please see discharge summary or AVS. )  AMBULATORY STATUS COMMUNICATION OF NEEDS Skin   Extensive Assist Verbally Normal                       Personal Care Assistance Level of Assistance  Bathing, Feeding, Dressing Bathing Assistance: Maximum assistance Feeding assistance: Limited assistance Dressing Assistance: Maximum assistance     Functional Limitations Info  Sight, Speech,  Hearing Sight Info: Adequate Hearing Info: Adequate Speech Info: Adequate    SPECIAL CARE FACTORS FREQUENCY  PT (By licensed PT), OT (By licensed OT)     PT Frequency: 5 times a week OT Frequency: 5 times a week             Contractures Contractures Info: Not present    Additional Factors Info  Code Status, Allergies Code Status Info: Prior  Allergies Info: Erythromycin, Fosamax Alendronate Sodium           Current Medications (06/19/2017):  This is the current hospital active medication list No current facility-administered medications for this encounter.    Current Outpatient Medications  Medication Sig Dispense Refill  . apixaban (ELIQUIS) 5 MG TABS tablet Take 5 mg by mouth 2 (two) times daily.    Marland Kitchen atorvastatin (LIPITOR) 40 MG tablet Take 40 mg by mouth daily.      . Calcium Carbonate-Vitamin D (CALTRATE 600+D PO) Take 1 tablet by mouth 2 (two) times daily.    . diphenhydramine-acetaminophen (TYLENOL PM) 25-500 MG TABS tablet Take 2 tablets by mouth at bedtime as needed.    Marland Kitchen glipiZIDE (GLUCOTROL) 5 MG tablet Take 10 mg by mouth 2 (two) times daily.  0  . latanoprost (XALATAN) 0.005 % ophthalmic solution Place 1 drop into both eyes at bedtime.    Marland Kitchen levothyroxine (SYNTHROID, LEVOTHROID) 137 MCG tablet Take 1 tablet (137 mcg total) by mouth daily. [place]  tablet 0  . metFORMIN (GLUCOPHAGE) 1000 MG tablet Take 1,000 mg by mouth 2 (two) times daily.  2  . Multiple Vitamin (MULITIVITAMIN WITH MINERALS) TABS Take 1 tablet by mouth daily.      Marland Kitchen NAMZARIC 28-10 MG CP24 Take 1 capsule by mouth daily.  3  . sertraline (ZOLOFT) 50 MG tablet Take 50 mg by mouth daily at 6 PM.   3  . TRESIBA FLEXTOUCH 100 UNIT/ML SOPN FlexTouch Pen Inject 12 Units into the skin at bedtime.  1  . aspirin EC 325 MG tablet Take 1 tablet (325 mg total) by mouth 2 (two) times daily after a meal. (Patient not taking: Reported on 06/19/2017) 30 tablet 0  . bacitracin ointment Apply topically 2 (two) times  daily. To abrasions (Patient not taking: Reported on 06/19/2017) 120 g 0  . enoxaparin (LOVENOX) 100 MG/ML injection Inject 0.7 mLs (70 mg total) into the skin every 12 (twelve) hours. (Patient not taking: Reported on 06/19/2017) 5 Syringe 0  . traMADol (ULTRAM) 50 MG tablet Take 1 tablet (50 mg total) by mouth every 6 (six) hours as needed for moderate pain. (Patient not taking: Reported on 01/23/2016) 20 tablet 0     Discharge Medications: Please see discharge summary for a list of discharge medications.  Relevant Imaging Results:  Relevant Lab Results:   Additional Information [id number].  Wetzel Bjornstad, LCSWA

## 2017-06-19 NOTE — ED Notes (Signed)
Attempted report to [facility]. [facility]AR called for pt

## 2017-06-19 NOTE — ED Triage Notes (Addendum)
Pt arrives from home by [facility] after having a fall while going to get her newspaper. Pt struck face and head is on eliqus. Pt also c/o of left wrist pain. Pt arrives with wrist splint on. Pt has history of dementia, unknown baseline at this time-family coming per ems.

## 2017-06-19 NOTE — Progress Notes (Signed)
CSW spoke with Claiborne Billings from [facility] and confirmed that she has a bed waiting for pt at the facility. CSW to fax over [facility] and AVS to facility at this time. RN has called for transport at this time. CSW will fax over AVS as well as FL2 as requested at this time. There are no further CSW needs CSW will sign of.  Jeanette Caprice S. Kemal Amores, MSW, [place] [facility] Clinical Social Worker [phone]

## 2017-06-19 NOTE — Progress Notes (Signed)
Orthopedic Tech Progress Note Patient Details:  [name] 21-Jul-1934 [id number]  Ortho Devices Type of Ortho Device: Sling immobilizer, Ace wrap, Sugartong splint Ortho Device/Splint Interventions: Application   Post Interventions Patient Tolerated: Well Instructions Provided: Care of device   [place] Pink 06/19/2017, 1:25 PM

## 2017-06-19 NOTE — Discharge Instructions (Signed)
As discussed, it is normal to feel worse in the days immediately following a fall regardless of medication use. ° °However, please take all medication as directed, use ice packs liberally.  If you develop any new, or concerning changes in your condition, please return here for further evaluation and management.   ° °Otherwise, please return followup with your physician ° ° ° °

## 2017-06-19 NOTE — ED Provider Notes (Signed)
[facility] EMERGENCY DEPARTMENT Provider Note   CSN: [id number] Arrival date & time: 06/19/17  5784     History   Chief Complaint Chief Complaint  Patient presents with  . Fall    HPI [name] is a 83 y.o. female.  HPI Elderly female presents from home after a fall. Patient has dementia, aphasia, level 5 caveat secondary to her inability to contribute to the HPI. Reportedly the patient went to fetch the newspaper, fell History is provided by the patient's daughter, related to EMS, who relates this to nursing staff. No report of hemodynamic instability in route, patient's daughter is not present to assist with evaluation initially. The patient herself is offering minimal responses. Past Medical History:  Diagnosis Date  . Aphasia   . Cancer ([place])   . Diabetes mellitus   . Personal history of radiation therapy   . Tobacco abuse     Patient Active Problem List   Diagnosis Date Noted  . Severe dementia 09/27/2016  . Hip fx ([place]) 07/26/2014  . Knee effusion   . Hip fracture, right ([place]) 07/25/2014  . Hip fracture ([place]) 07/25/2014  . Tobacco abuse   . Aphasia   . Fall   . Intertrochanteric fracture of right hip ([place])   . Multiple pelvic fractures 01/22/2011  . Hypothyroidism 09/15/2009  . Diabetes mellitus without complication ([place]) 69/62/9528  . Essential hypertension 09/15/2009  . Osteoporosis 09/15/2009  . HEEL PAIN, LEFT 09/14/2009  . Hx Breast cancer, IDC, Stage II, right, receptor +, her 2 neg. 03/26/2003    Past Surgical History:  Procedure Laterality Date  . BREAST LUMPECTOMY Right   . BREAST SURGERY    . FEMUR FRACTURE SURGERY    . INTRAMEDULLARY (IM) NAIL INTERTROCHANTERIC Right 07/26/2014   Procedure: INTRAMEDULLARY (IM) NAIL INTERTROCHANTRIC;  Surgeon: Susa Day, MD;  Location: [facility] ORS;  Service: Orthopedics;  Laterality: Right;     OB History   None      Home Medications    Prior to Admission medications     Medication Sig Start Date End Date Taking? Authorizing Provider  apixaban (ELIQUIS) 5 MG TABS tablet Take 5 mg by mouth 2 (two) times daily.   Yes [provider]  atorvastatin (LIPITOR) 40 MG tablet Take 40 mg by mouth daily.     Yes [provider]  Calcium Carbonate-Vitamin D (CALTRATE 600+D PO) Take 1 tablet by mouth 2 (two) times daily.   Yes [provider]  diphenhydramine-acetaminophen (TYLENOL PM) 25-500 MG TABS tablet Take 2 tablets by mouth at bedtime as needed.   Yes [provider]  glipiZIDE (GLUCOTROL) 5 MG tablet Take 10 mg by mouth 2 (two) times daily. 05/14/14  Yes [provider]  latanoprost (XALATAN) 0.005 % ophthalmic solution Place 1 drop into both eyes at bedtime. 04/16/17 07/15/17 Yes [provider]  levothyroxine (SYNTHROID, LEVOTHROID) 137 MCG tablet Take 1 tablet (137 mcg total) by mouth daily. 07/28/14  Yes Eugenie Filler, MD  metFORMIN (GLUCOPHAGE) 1000 MG tablet Take 1,000 mg by mouth 2 (two) times daily. 06/17/14  Yes [provider]  Multiple Vitamin (MULITIVITAMIN WITH MINERALS) TABS Take 1 tablet by mouth daily.     Yes [provider]  NAMZARIC 28-10 MG CP24 Take 1 capsule by mouth daily. 12/21/15  Yes [provider]  sertraline (ZOLOFT) 50 MG tablet Take 50 mg by mouth daily at 6 PM.  05/24/17  Yes [provider]  TRESIBA FLEXTOUCH 100 UNIT/ML  SOPN FlexTouch Pen Inject 12 Units into the skin at bedtime. 06/12/17  Yes [provider]  aspirin EC 325 MG tablet Take 1 tablet (325 mg total) by mouth 2 (two) times daily after a meal. Patient not taking: Reported on 06/19/2017 07/26/14   Susa Day, MD  bacitracin ointment Apply topically 2 (two) times daily. To abrasions Patient not taking: Reported on 06/19/2017 07/28/14   Eugenie Filler, MD  enoxaparin (LOVENOX) 100 MG/ML injection Inject 0.7 mLs (70 mg total) into the skin every 12 (twelve) hours. Patient not  taking: Reported on 06/19/2017 08/08/14   Fredia Sorrow, MD  traMADol (ULTRAM) 50 MG tablet Take 1 tablet (50 mg total) by mouth every 6 (six) hours as needed for moderate pain. Patient not taking: Reported on 01/23/2016 07/28/14   Eugenie Filler, MD    Family History Family History  Problem Relation Age of Onset  . Dementia Mother   . Congestive Heart Failure Father     Social History Social History   Tobacco Use  . Smoking status: Current Some Day Smoker  . Smokeless tobacco: Never Used  Substance Use Topics  . Alcohol use: No  . Drug use: No     Allergies   Erythromycin and Fosamax [alendronate sodium]   Review of Systems Review of Systems  Unable to perform ROS: Dementia     Physical Exam Updated Vital Signs BP (!) 146/74   Pulse 87   Temp (!) 97.3 F (36.3 C) (Axillary)   Resp 14   SpO2 99%   Physical Exam  Constitutional:  Sickly and frail appearing elderly female sitting upright, with no respiratory distress, but not communicating  HENT:  Head: Normocephalic.    Mouth/Throat:    Eyes: Conjunctivae and EOM are normal.  Cardiovascular: Normal rate and regular rhythm.  Pulmonary/Chest: Effort normal and breath sounds normal. No stridor. No respiratory distress.  Abdominal: She exhibits no distension.  Musculoskeletal: She exhibits no edema.       Arms: Neurological: She is alert. She displays atrophy and tremor. No cranial nerve deficit. She displays no seizure activity.  Skin: Skin is warm and dry.  Psychiatric: She is slowed and withdrawn. Cognition and memory are impaired.  Nursing note and vitals reviewed.    ED Treatments / Results  Labs (all labs ordered are listed, but only abnormal results are displayed) Labs Reviewed - No data to display  EKG Sinus rhythm, rate 72, artifact, poor study in lead V1, normal axis, nonspecific ST wave changes, borderline EKG  Radiology Dg Pelvis 1-2 Views  Result Date: 06/19/2017 CLINICAL DATA:   [place] today, mental status change, limited history. EXAM: PELVIS - 1-2 VIEW COMPARISON:  Coronal and sagittal images through the pelvis from an abdominal and pelvic CT scan of January 23, 2016 FINDINGS: The bones are subjectively osteopenic. The patient has undergone previous ORIF for an intertrochanteric fracture of the right hip. The intramedullary rod and telescoping screw appear intact where visualized. There is deformity of the inferior pubic ramus on the right which is chronic. The pubic rami on the left are intact. The native left hip joint space exhibits mild asymmetric narrowing. The iliac bones are intact. The sacrum exhibits no acute abnormality. IMPRESSION: No acute pelvic fracture is observed. Previous fractures of the inferior pubic ramus on the right and the intertrochanteric region of the right femur are stable. Electronically Signed   By: David  Martinique M.D.   On: 06/19/2017 10:29   Dg Wrist Complete Left  Result Date: 06/19/2017 CLINICAL DATA:  Left wrist pain secondary to a fall today. EXAM: LEFT WRIST - COMPLETE 3+ VIEW COMPARISON:  10/11/2004 FINDINGS: There is a dorsally impacted comminuted fracture of the distal radius. The fracture involves the articular surface. Avulsion of the ulnar styloid. Chondrocalcinosis and arthritis in the radiocarpal joint. Congenital fusion of the lunate and triquetrum. Arthritic changes at the first, fourth, and fifth CMC joints. IMPRESSION: 1. Impacted comminuted fracture of the distal left radius with avulsion of the ulnar styloid. 2. Arthritic changes of the radiocarpal joint and of multiple carpal metacarpal joints. 3. Congenital fusion of the lunate and triquetrum. Electronically Signed   By: Lorriane Shire M.D.   On: 06/19/2017 10:34   Ct Head Wo Contrast  Result Date: 06/19/2017 CLINICAL DATA:  83 year old female post ground level fall. Initial encounter. EXAM: CT HEAD WITHOUT CONTRAST CT MAXILLOFACIAL WITHOUT CONTRAST CT CERVICAL SPINE WITHOUT  CONTRAST TECHNIQUE: Multidetector CT imaging of the head, cervical spine, and maxillofacial structures were performed using the standard protocol without intravenous contrast. Multiplanar CT image reconstructions of the cervical spine and maxillofacial structures were also generated. COMPARISON:  12/12/2012 brain MR. 06/13/2011 head CT. FINDINGS: CT HEAD FINDINGS Brain: No intracranial hemorrhage or CT evidence of large acute infarct. Chronic microvascular changes. Global moderate atrophy. No intracranial mass lesion noted on this unenhanced exam. Vascular: Vascular calcifications Skull: No skull fracture. Other: Negative subcutaneous hematoma. CT MAXILLOFACIAL FINDINGS Osseous: Subtle fracture anterior maxillary spine with slight loosening of the central incisor bilaterally and right lateral incisor. Orbits: No orbital injury noted. Sinuses: Minimal polypoid opacification inferior right maxillary sinus. Soft tissues: Tiny amount of gas adjacent to maxillary spine fracture. CT CERVICAL SPINE FINDINGS Alignment: Normal alignment. Skull base and vertebrae: No cervical spine fracture. Soft tissues and spinal canal: No abnormal prevertebral soft tissue swelling. Disc levels:  Transverse ligament hypertrophy. Cervical spondylotic changes most notable C5-6 with mild spinal stenosis. Upper chest: No worrisome apical lung mass. Other: Carotid bifurcation calcifications. IMPRESSION: No skull fracture or intracranial hemorrhage. Subtle fracture anterior maxillary spine with slight loosening of the central incisor bilaterally and right lateral incisor. No cervical spine fracture, malalignment or abnormal prevertebral soft tissue swelling. Chronic changes as noted above. Electronically Signed   By: Genia Del M.D.   On: 06/19/2017 11:21   Ct Cervical Spine Wo Contrast  Result Date: 06/19/2017 CLINICAL DATA:  83 year old female post ground level fall. Initial encounter. EXAM: CT HEAD WITHOUT CONTRAST CT MAXILLOFACIAL  WITHOUT CONTRAST CT CERVICAL SPINE WITHOUT CONTRAST TECHNIQUE: Multidetector CT imaging of the head, cervical spine, and maxillofacial structures were performed using the standard protocol without intravenous contrast. Multiplanar CT image reconstructions of the cervical spine and maxillofacial structures were also generated. COMPARISON:  12/12/2012 brain MR. 06/13/2011 head CT. FINDINGS: CT HEAD FINDINGS Brain: No intracranial hemorrhage or CT evidence of large acute infarct. Chronic microvascular changes. Global moderate atrophy. No intracranial mass lesion noted on this unenhanced exam. Vascular: Vascular calcifications Skull: No skull fracture. Other: Negative subcutaneous hematoma. CT MAXILLOFACIAL FINDINGS Osseous: Subtle fracture anterior maxillary spine with slight loosening of the central incisor bilaterally and right lateral incisor. Orbits: No orbital injury noted. Sinuses: Minimal polypoid opacification inferior right maxillary sinus. Soft tissues: Tiny amount of gas adjacent to maxillary spine fracture. CT CERVICAL SPINE FINDINGS Alignment: Normal alignment. Skull base and vertebrae: No cervical spine fracture. Soft tissues and spinal canal: No abnormal prevertebral soft tissue swelling. Disc levels:  Transverse ligament hypertrophy. Cervical spondylotic changes most notable C5-6 with mild  spinal stenosis. Upper chest: No worrisome apical lung mass. Other: Carotid bifurcation calcifications. IMPRESSION: No skull fracture or intracranial hemorrhage. Subtle fracture anterior maxillary spine with slight loosening of the central incisor bilaterally and right lateral incisor. No cervical spine fracture, malalignment or abnormal prevertebral soft tissue swelling. Chronic changes as noted above. Electronically Signed   By: Genia Del M.D.   On: 06/19/2017 11:21   Ct Maxillofacial Wo Cm  Result Date: 06/19/2017 CLINICAL DATA:  83 year old female post ground level fall. Initial encounter. EXAM: CT HEAD  WITHOUT CONTRAST CT MAXILLOFACIAL WITHOUT CONTRAST CT CERVICAL SPINE WITHOUT CONTRAST TECHNIQUE: Multidetector CT imaging of the head, cervical spine, and maxillofacial structures were performed using the standard protocol without intravenous contrast. Multiplanar CT image reconstructions of the cervical spine and maxillofacial structures were also generated. COMPARISON:  12/12/2012 brain MR. 06/13/2011 head CT. FINDINGS: CT HEAD FINDINGS Brain: No intracranial hemorrhage or CT evidence of large acute infarct. Chronic microvascular changes. Global moderate atrophy. No intracranial mass lesion noted on this unenhanced exam. Vascular: Vascular calcifications Skull: No skull fracture. Other: Negative subcutaneous hematoma. CT MAXILLOFACIAL FINDINGS Osseous: Subtle fracture anterior maxillary spine with slight loosening of the central incisor bilaterally and right lateral incisor. Orbits: No orbital injury noted. Sinuses: Minimal polypoid opacification inferior right maxillary sinus. Soft tissues: Tiny amount of gas adjacent to maxillary spine fracture. CT CERVICAL SPINE FINDINGS Alignment: Normal alignment. Skull base and vertebrae: No cervical spine fracture. Soft tissues and spinal canal: No abnormal prevertebral soft tissue swelling. Disc levels:  Transverse ligament hypertrophy. Cervical spondylotic changes most notable C5-6 with mild spinal stenosis. Upper chest: No worrisome apical lung mass. Other: Carotid bifurcation calcifications. IMPRESSION: No skull fracture or intracranial hemorrhage. Subtle fracture anterior maxillary spine with slight loosening of the central incisor bilaterally and right lateral incisor. No cervical spine fracture, malalignment or abnormal prevertebral soft tissue swelling. Chronic changes as noted above. Electronically Signed   By: Genia Del M.D.   On: 06/19/2017 11:21    Procedures .Marland KitchenLaceration Repair Date/Time: 06/19/2017 2:00 PM Performed by: Carmin Muskrat,  MD Authorized by: Carmin Muskrat, MD   Consent:    Consent obtained:  Verbal   Consent given by:  Healthcare agent   Risks discussed:  Need for additional repair, poor wound healing and poor cosmetic result   Alternatives discussed:  No treatment Universal protocol:    Procedure explained and questions answered to patient or proxy's satisfaction: yes     Relevant documents present and verified: yes     Test results available and properly labeled: yes     Imaging studies available: yes     Required blood products, implants, devices, and special equipment available: yes     Site/side marked: yes     Immediately prior to procedure, a time out was called: yes     Patient identity confirmed:  Verbally with patient and hospital-assigned identification number Anesthesia (see MAR for exact dosages):    Anesthesia method:  Local infiltration   Local anesthetic:  Lidocaine 1% w/o epi Laceration details:    Location:  Mouth   Mouth location:  R buccal mucosa   Length (cm):  5   Depth (mm):  4 Repair type:    Repair type:  Simple Pre-procedure details:    Preparation:  Patient was prepped and draped in usual sterile fashion Exploration:    Hemostasis achieved with:  Direct pressure   Wound exploration: entire depth of wound probed and visualized     Wound extent: vascular  damage     Contaminated: no   Treatment:    Area cleansed with:  Saline   Amount of cleaning:  Standard   Irrigation solution:  Sterile saline   Irrigation volume:  10   Visualized foreign bodies/material removed: no   Skin repair:    Repair method:  Sutures   Suture size:  5-0   Suture material:  Plain gut   Suture technique:  Simple interrupted   Number of sutures:  4 Approximation:    Approximation:  Close Post-procedure details:    Dressing:  Open (no dressing)   Patient tolerance of procedure:  Tolerated well, no immediate complications   (including critical care time)  Medications Ordered in  ED Medications - No data to display   Initial Impression / Assessment and Plan / ED Course  I have reviewed the triage vital signs and the nursing notes.  Pertinent labs & imaging results that were available during my care of the patient were reviewed by me and considered in my medical decision making (see chart for details).     11:51 AM Patient in no distress, now accompanied by a friend who lives with the patient. She notes that the patient is interacting in a typical manner, acknowledges baseline aphasia, dementia. We discussed today's CT findings including evidence for maxilla fracture, and on exams, the patient continues to complain of pain in her face, left wrist. X-ray also demonstrates distal radius fracture.  I d/w ortho.  Patient is receiving a splint.    2:24 PM Patient tolerated laceration repair splint has been applied, well-tolerated. No change in distal neurologic status.  After further cleaning of the patient's facial wound, no other wounds are amenable to repair. I discussed all findings with patient and her friend, who is the power of attorney Patient continues to be in similar condition, moving all extremities, confused, consistent with her history of dementia. Absent evidence for intracranial pathology, cervical spine fracture, skull fracture, though with notation of other fractures, including maxilla, and need to follow-up with a dental, as well as orthopedics for further evaluation of her left wrist fracture, maxilla fracture, broken a dental implant, patient discharged in stable condition. Final Clinical Impressions(s) / ED Diagnoses   Final diagnoses:  Fall, initial encounter  Lip laceration, initial encounter  Facial abrasion, initial encounter  Closed fracture of left wrist, initial encounter  Maxilla fracture Dental fracture    Carmin Muskrat, MD 06/19/17 1515

## 2017-06-19 NOTE — Consult Note (Signed)
Reason for Consult:Left distal radius fx Referring Physician: [name] is an 83 y.o. female.  HPI: [name] was getting the paper this morning and toppled over when she went to pick it up. She has Alzheimer's and aphasia and history was provided by companion who lives with her. She was brought to the ED where x-rays showed a non-displaced distal radius fx and hand surgery was consulted.  Past Medical History:  Diagnosis Date  . Aphasia   . Cancer ([place])   . Diabetes mellitus   . Personal history of radiation therapy   . Tobacco abuse     Past Surgical History:  Procedure Laterality Date  . BREAST LUMPECTOMY Right   . BREAST SURGERY    . FEMUR FRACTURE SURGERY    . INTRAMEDULLARY (IM) NAIL INTERTROCHANTERIC Right 07/26/2014   Procedure: INTRAMEDULLARY (IM) NAIL INTERTROCHANTRIC;  Surgeon: Susa Day, MD;  Location: [facility];  Service: Orthopedics;  Laterality: Right;    Family History  Problem Relation Age of Onset  . Dementia Mother   . Congestive Heart Failure Father     Social History:  reports that she has been smoking.  She has never used smokeless tobacco. She reports that she does not drink alcohol or use drugs.  Allergies:  Allergies  Allergen Reactions  . Erythromycin Hives  . Fosamax [Alendronate Sodium] Other (See Comments)    Reaction Unknown    Medications: I have reviewed the patient's current medications.  No results found for this or any previous visit (from the past 48 hour(s)).  Dg Pelvis 1-2 Views  Result Date: 06/19/2017 CLINICAL DATA:  [place] today, mental status change, limited history. EXAM: PELVIS - 1-2 VIEW COMPARISON:  Coronal and sagittal images through the pelvis from an abdominal and pelvic CT scan of January 23, 2016 FINDINGS: The bones are subjectively osteopenic. The patient has undergone previous ORIF for an intertrochanteric fracture of the right hip. The intramedullary rod and telescoping screw appear intact where  visualized. There is deformity of the inferior pubic ramus on the right which is chronic. The pubic rami on the left are intact. The native left hip joint space exhibits mild asymmetric narrowing. The iliac bones are intact. The sacrum exhibits no acute abnormality. IMPRESSION: No acute pelvic fracture is observed. Previous fractures of the inferior pubic ramus on the right and the intertrochanteric region of the right femur are stable. Electronically Signed   By: David  Martinique M.D.   On: 06/19/2017 10:29   Dg Wrist Complete Left  Result Date: 06/19/2017 CLINICAL DATA:  Left wrist pain secondary to a fall today. EXAM: LEFT WRIST - COMPLETE 3+ VIEW COMPARISON:  10/11/2004 FINDINGS: There is a dorsally impacted comminuted fracture of the distal radius. The fracture involves the articular surface. Avulsion of the ulnar styloid. Chondrocalcinosis and arthritis in the radiocarpal joint. Congenital fusion of the lunate and triquetrum. Arthritic changes at the first, fourth, and fifth CMC joints. IMPRESSION: 1. Impacted comminuted fracture of the distal left radius with avulsion of the ulnar styloid. 2. Arthritic changes of the radiocarpal joint and of multiple carpal metacarpal joints. 3. Congenital fusion of the lunate and triquetrum. Electronically Signed   By: Lorriane Shire M.D.   On: 06/19/2017 10:34   Ct Head Wo Contrast  Result Date: 06/19/2017 CLINICAL DATA:  83 year old female post ground level fall. Initial encounter. EXAM: CT HEAD WITHOUT CONTRAST CT MAXILLOFACIAL WITHOUT CONTRAST CT CERVICAL SPINE WITHOUT CONTRAST TECHNIQUE: Multidetector CT imaging of the head, cervical spine, and  maxillofacial structures were performed using the standard protocol without intravenous contrast. Multiplanar CT image reconstructions of the cervical spine and maxillofacial structures were also generated. COMPARISON:  12/12/2012 brain MR. 06/13/2011 head CT. FINDINGS: CT HEAD FINDINGS Brain: No intracranial hemorrhage or  CT evidence of large acute infarct. Chronic microvascular changes. Global moderate atrophy. No intracranial mass lesion noted on this unenhanced exam. Vascular: Vascular calcifications Skull: No skull fracture. Other: Negative subcutaneous hematoma. CT MAXILLOFACIAL FINDINGS Osseous: Subtle fracture anterior maxillary spine with slight loosening of the central incisor bilaterally and right lateral incisor. Orbits: No orbital injury noted. Sinuses: Minimal polypoid opacification inferior right maxillary sinus. Soft tissues: Tiny amount of gas adjacent to maxillary spine fracture. CT CERVICAL SPINE FINDINGS Alignment: Normal alignment. Skull base and vertebrae: No cervical spine fracture. Soft tissues and spinal canal: No abnormal prevertebral soft tissue swelling. Disc levels:  Transverse ligament hypertrophy. Cervical spondylotic changes most notable C5-6 with mild spinal stenosis. Upper chest: No worrisome apical lung mass. Other: Carotid bifurcation calcifications. IMPRESSION: No skull fracture or intracranial hemorrhage. Subtle fracture anterior maxillary spine with slight loosening of the central incisor bilaterally and right lateral incisor. No cervical spine fracture, malalignment or abnormal prevertebral soft tissue swelling. Chronic changes as noted above. Electronically Signed   By: Genia Del M.D.   On: 06/19/2017 11:21   Ct Cervical Spine Wo Contrast  Result Date: 06/19/2017 CLINICAL DATA:  83 year old female post ground level fall. Initial encounter. EXAM: CT HEAD WITHOUT CONTRAST CT MAXILLOFACIAL WITHOUT CONTRAST CT CERVICAL SPINE WITHOUT CONTRAST TECHNIQUE: Multidetector CT imaging of the head, cervical spine, and maxillofacial structures were performed using the standard protocol without intravenous contrast. Multiplanar CT image reconstructions of the cervical spine and maxillofacial structures were also generated. COMPARISON:  12/12/2012 brain MR. 06/13/2011 head CT. FINDINGS: CT HEAD  FINDINGS Brain: No intracranial hemorrhage or CT evidence of large acute infarct. Chronic microvascular changes. Global moderate atrophy. No intracranial mass lesion noted on this unenhanced exam. Vascular: Vascular calcifications Skull: No skull fracture. Other: Negative subcutaneous hematoma. CT MAXILLOFACIAL FINDINGS Osseous: Subtle fracture anterior maxillary spine with slight loosening of the central incisor bilaterally and right lateral incisor. Orbits: No orbital injury noted. Sinuses: Minimal polypoid opacification inferior right maxillary sinus. Soft tissues: Tiny amount of gas adjacent to maxillary spine fracture. CT CERVICAL SPINE FINDINGS Alignment: Normal alignment. Skull base and vertebrae: No cervical spine fracture. Soft tissues and spinal canal: No abnormal prevertebral soft tissue swelling. Disc levels:  Transverse ligament hypertrophy. Cervical spondylotic changes most notable C5-6 with mild spinal stenosis. Upper chest: No worrisome apical lung mass. Other: Carotid bifurcation calcifications. IMPRESSION: No skull fracture or intracranial hemorrhage. Subtle fracture anterior maxillary spine with slight loosening of the central incisor bilaterally and right lateral incisor. No cervical spine fracture, malalignment or abnormal prevertebral soft tissue swelling. Chronic changes as noted above. Electronically Signed   By: Genia Del M.D.   On: 06/19/2017 11:21   Ct Maxillofacial Wo Cm  Result Date: 06/19/2017 CLINICAL DATA:  83 year old female post ground level fall. Initial encounter. EXAM: CT HEAD WITHOUT CONTRAST CT MAXILLOFACIAL WITHOUT CONTRAST CT CERVICAL SPINE WITHOUT CONTRAST TECHNIQUE: Multidetector CT imaging of the head, cervical spine, and maxillofacial structures were performed using the standard protocol without intravenous contrast. Multiplanar CT image reconstructions of the cervical spine and maxillofacial structures were also generated. COMPARISON:  12/12/2012 brain MR.  06/13/2011 head CT. FINDINGS: CT HEAD FINDINGS Brain: No intracranial hemorrhage or CT evidence of large acute infarct. Chronic microvascular changes. Global moderate atrophy.  No intracranial mass lesion noted on this unenhanced exam. Vascular: Vascular calcifications Skull: No skull fracture. Other: Negative subcutaneous hematoma. CT MAXILLOFACIAL FINDINGS Osseous: Subtle fracture anterior maxillary spine with slight loosening of the central incisor bilaterally and right lateral incisor. Orbits: No orbital injury noted. Sinuses: Minimal polypoid opacification inferior right maxillary sinus. Soft tissues: Tiny amount of gas adjacent to maxillary spine fracture. CT CERVICAL SPINE FINDINGS Alignment: Normal alignment. Skull base and vertebrae: No cervical spine fracture. Soft tissues and spinal canal: No abnormal prevertebral soft tissue swelling. Disc levels:  Transverse ligament hypertrophy. Cervical spondylotic changes most notable C5-6 with mild spinal stenosis. Upper chest: No worrisome apical lung mass. Other: Carotid bifurcation calcifications. IMPRESSION: No skull fracture or intracranial hemorrhage. Subtle fracture anterior maxillary spine with slight loosening of the central incisor bilaterally and right lateral incisor. No cervical spine fracture, malalignment or abnormal prevertebral soft tissue swelling. Chronic changes as noted above. Electronically Signed   By: Genia Del M.D.   On: 06/19/2017 11:21    Review of Systems  Unable to perform ROS: Patient nonverbal   Blood pressure 124/90, pulse 88, temperature (!) 97.3 F (36.3 C), temperature source Axillary, resp. rate 14, SpO2 97 %. Physical Exam  Constitutional: She appears well-developed and well-nourished. No distress.  HENT:  Head: Normocephalic.  Eyes: Conjunctivae are normal. Right eye exhibits no discharge. Left eye exhibits no discharge. No scleral icterus.  Neck: Normal range of motion.  Cardiovascular: Normal rate and regular  rhythm.  Respiratory: Effort normal. No respiratory distress.  Musculoskeletal:  Left shoulder, elbow, wrist, digits- no skin wounds, TTP wrist, no instability, no blocks to motion  Sens  Ax/Kelly/U intact, M absent  Mot   Ax/ Kelly/ PIN/ M/ AIN/ U intact  Rad 2+  Neurological: She is alert.  Skin: Skin is warm and dry. She is not diaphoretic.  Psychiatric: She has a normal mood and affect. Her behavior is normal.    Assessment/Plan: Fall Left distal radius fx -- In good position. Will splint and have f/u with Dr. Amedeo Plenty in office in 1-2 weeks. NWB. Facial abrasions/lacs -- per EDP Alzheimer's disease    Lisette Abu, PA-C Orthopedic Surgery [phone] 06/19/2017, 12:28 PM

## 2017-06-19 NOTE — ED Notes (Signed)
Fentanyl wasted with Alexa Laurin Coder

## 2017-08-28 ENCOUNTER — Emergency Department (HOSPITAL_COMMUNITY): Payer: Medicare Other

## 2017-08-28 ENCOUNTER — Encounter (HOSPITAL_COMMUNITY): Payer: Self-pay

## 2017-08-28 ENCOUNTER — Emergency Department (HOSPITAL_COMMUNITY)
Admission: EM | Admit: 2017-08-28 | Discharge: 2017-08-29 | Disposition: A | Discharge status: Home / Self Care | Payer: Medicare Other | Attending: Emergency Medicine | Admitting: Emergency Medicine

## 2017-08-28 DIAGNOSIS — S0990XA Unspecified injury of head, initial encounter: Secondary | ICD-10-CM | POA: Diagnosis present

## 2017-08-28 DIAGNOSIS — S0181XA Laceration without foreign body of other part of head, initial encounter: Secondary | ICD-10-CM | POA: Diagnosis not present

## 2017-08-28 DIAGNOSIS — Z79899 Other long term (current) drug therapy: Secondary | ICD-10-CM | POA: Insufficient documentation

## 2017-08-28 DIAGNOSIS — I10 Essential (primary) hypertension: Secondary | ICD-10-CM | POA: Diagnosis not present

## 2017-08-28 DIAGNOSIS — Z853 Personal history of malignant neoplasm of breast: Secondary | ICD-10-CM | POA: Diagnosis not present

## 2017-08-28 DIAGNOSIS — Z23 Encounter for immunization: Secondary | ICD-10-CM | POA: Insufficient documentation

## 2017-08-28 DIAGNOSIS — E039 Hypothyroidism, unspecified: Secondary | ICD-10-CM | POA: Insufficient documentation

## 2017-08-28 DIAGNOSIS — Z794 Long term (current) use of insulin: Secondary | ICD-10-CM | POA: Insufficient documentation

## 2017-08-28 DIAGNOSIS — F17200 Nicotine dependence, unspecified, uncomplicated: Secondary | ICD-10-CM | POA: Diagnosis not present

## 2017-08-28 DIAGNOSIS — Y999 Unspecified external cause status: Secondary | ICD-10-CM | POA: Insufficient documentation

## 2017-08-28 DIAGNOSIS — W19XXXA Unspecified fall, initial encounter: Secondary | ICD-10-CM | POA: Diagnosis not present

## 2017-08-28 DIAGNOSIS — Y939 Activity, unspecified: Secondary | ICD-10-CM | POA: Diagnosis not present

## 2017-08-28 DIAGNOSIS — E119 Type 2 diabetes mellitus without complications: Secondary | ICD-10-CM | POA: Diagnosis not present

## 2017-08-28 DIAGNOSIS — Y92129 Unspecified place in nursing home as the place of occurrence of the external cause: Secondary | ICD-10-CM | POA: Insufficient documentation

## 2017-08-28 MED ORDER — TETANUS-DIPHTH-ACELL PERTUSSIS 5-2.5-18.5 LF-MCG/0.5 IM SUSP
0.5000 mL | Freq: Once | INTRAMUSCULAR | Status: AC
  Administered 2017-08-28: 0.5 mL via INTRAMUSCULAR
  Filled 2017-08-28: qty 0.5

## 2017-08-28 MED ORDER — LORAZEPAM 2 MG/ML IJ SOLN
2.0000 mg | Freq: Once | INTRAMUSCULAR | Status: AC
  Administered 2017-08-28: 2 mg via INTRAMUSCULAR
  Filled 2017-08-28: qty 1

## 2017-08-28 NOTE — ED Notes (Signed)
Pt refused the tetanus injection

## 2017-08-28 NOTE — ED Triage Notes (Signed)
Pt had an unwitnessed fall tonight, staff went to check on her and she was sitting beside her wheelchair crying with a laceration above her eye and a skin tear on her arm Pt has a history of wondering around the facility unsupervised

## 2017-08-28 NOTE — ED Provider Notes (Signed)
[facility] DEPT Provider Note   CSN: [id number] Arrival date & time: 08/28/17  1951     History   Chief Complaint Chief Complaint  Patient presents with  . Fall    HPI [name] is a 83 y.o. female.  Patient is a 83 year old female with a history of diabetes and dementia who lives in a nursing facility who sustained an unwitnessed fall.  She reportedly ambulates frequently without assistance and has had falls in the past.  She is noted to have a laceration above her right eye.  She has not had any other noted injuries.  Her last tetanus is unknown.     Past Medical History:  Diagnosis Date  . Aphasia   . Cancer ([place])   . Diabetes mellitus   . Personal history of radiation therapy   . Tobacco abuse     Patient Active Problem List   Diagnosis Date Noted  . Severe dementia 09/27/2016  . Hip fx ([place]) 07/26/2014  . Knee effusion   . Hip fracture, right ([place]) 07/25/2014  . Hip fracture ([place]) 07/25/2014  . Tobacco abuse   . Aphasia   . Fall   . Intertrochanteric fracture of right hip ([place])   . Multiple pelvic fractures 01/22/2011  . Hypothyroidism 09/15/2009  . Diabetes mellitus without complication ([place]) 59/56/3875  . Essential hypertension 09/15/2009  . Osteoporosis 09/15/2009  . HEEL PAIN, LEFT 09/14/2009  . Hx Breast cancer, IDC, Stage II, right, receptor +, her 2 neg. 03/26/2003    Past Surgical History:  Procedure Laterality Date  . BREAST LUMPECTOMY Right   . BREAST SURGERY    . FEMUR FRACTURE SURGERY    . INTRAMEDULLARY (IM) NAIL INTERTROCHANTERIC Right 07/26/2014   Procedure: INTRAMEDULLARY (IM) NAIL INTERTROCHANTRIC;  Surgeon: Susa Day, MD;  Location: [facility];  Service: Orthopedics;  Laterality: Right;     OB History   None      Home Medications    Prior to Admission medications   Medication Sig Start Date End Date Taking? Authorizing Provider  atorvastatin (LIPITOR) 40 MG tablet Take 40 mg by mouth  daily.     Yes [provider]  Calcium Carbonate-Vitamin D (CALTRATE 600+D PO) Take 1 tablet by mouth 2 (two) times daily.   Yes [provider]  chlorhexidine (PERIDEX) 0.12 % solution Use as directed 15 mLs in the mouth or throat 2 (two) times daily.   Yes [provider]  insulin glargine (LANTUS) 100 UNIT/ML injection Inject 8 Units into the skin daily.   Yes [provider]  latanoprost (XALATAN) 0.005 % ophthalmic solution Place 1 drop into both eyes at bedtime.   Yes [provider]  levothyroxine (SYNTHROID, LEVOTHROID) 137 MCG tablet Take 1 tablet (137 mcg total) by mouth daily. 07/28/14  Yes Eugenie Filler, MD  metFORMIN (GLUCOPHAGE) 1000 MG tablet Take 1,000 mg by mouth 2 (two) times daily. 06/17/14  Yes [provider]  Multiple Vitamin (MULITIVITAMIN WITH MINERALS) TABS Take 1 tablet by mouth daily.     Yes [provider]  NAMZARIC 28-10 MG CP24 Take 1 capsule by mouth daily. 12/21/15  Yes [provider]  sertraline (ZOLOFT) 50 MG tablet Take 50 mg by mouth every morning.  05/24/17  Yes [provider]  TRESIBA FLEXTOUCH 100 UNIT/ML SOPN FlexTouch Pen Inject 14 Units into the skin at bedtime.  06/12/17  Yes [provider]  apixaban (ELIQUIS) 5 MG TABS tablet Take 5 mg by mouth  2 (two) times daily.    [provider]  aspirin EC 325 MG tablet Take 1 tablet (325 mg total) by mouth 2 (two) times daily after a meal. Patient not taking: Reported on 06/19/2017 07/26/14   Susa Day, MD  bacitracin ointment Apply topically 2 (two) times daily. To abrasions Patient not taking: Reported on 06/19/2017 07/28/14   Eugenie Filler, MD  diphenhydramine-acetaminophen (TYLENOL PM) 25-500 MG TABS tablet Take 2 tablets by mouth at bedtime as needed.    [provider]  enoxaparin (LOVENOX) 100 MG/ML injection Inject 0.7 mLs (70 mg total) into the skin every 12 (twelve) hours. Patient not  taking: Reported on 06/19/2017 08/08/14   Fredia Sorrow, MD  glipiZIDE (GLUCOTROL) 5 MG tablet Take 10 mg by mouth 2 (two) times daily. 05/14/14   [provider]  traMADol (ULTRAM) 50 MG tablet Take 1 tablet (50 mg total) by mouth every 6 (six) hours as needed for moderate pain. Patient not taking: Reported on 01/23/2016 07/28/14   Eugenie Filler, MD    Family History Family History  Problem Relation Age of Onset  . Dementia Mother   . Congestive Heart Failure Father     Social History Social History   Tobacco Use  . Smoking status: Current Some Day Smoker  . Smokeless tobacco: Never Used  Substance Use Topics  . Alcohol use: No  . Drug use: No     Allergies   Erythromycin and Fosamax [alendronate sodium]   Review of Systems Review of Systems  Unable to perform ROS: Dementia     Physical Exam Updated Vital Signs BP (!) 141/76 (BP Location: Right Arm)   Pulse 66   Temp 97.6 F (36.4 C) (Axillary)   Resp 16   SpO2 96%   Physical Exam  Constitutional: She is oriented to person, place, and time. She appears well-developed and well-nourished.  HENT:  Head: Normocephalic.  2 cm laceration over the right eyebrow, no active bleeding no evident eye injury  Eyes: Pupils are equal, round, and reactive to light. Conjunctivae and EOM are normal.  Neck:  Patient has a c-collar in place.  There is no bony tenderness over the cervical, thoracic or lumbosacral spine  Cardiovascular: Normal rate, regular rhythm and normal heart sounds.  Pulmonary/Chest: Effort normal and breath sounds normal. No respiratory distress. She has no wheezes. She has no rales. She exhibits no tenderness.  Abdominal: Soft. Bowel sounds are normal. There is no tenderness. There is no rebound and no guarding.  Musculoskeletal: Normal range of motion. She exhibits no edema.  No pain on range of motion of the extremities, there is a small skin tear to the right forearm  Lymphadenopathy:    She  has no cervical adenopathy.  Neurological: She is alert and oriented to person, place, and time.  Skin: Skin is warm and dry. No rash noted.  Psychiatric: She has a normal mood and affect.     ED Treatments / Results  Labs (all labs ordered are listed, but only abnormal results are displayed) Labs Reviewed - No data to display  EKG None  Radiology Ct Head Wo Contrast  Result Date: 08/28/2017 CLINICAL DATA:  83 year old with unwitnessed fall. EXAM: CT HEAD WITHOUT CONTRAST CT CERVICAL SPINE WITHOUT CONTRAST TECHNIQUE: Multidetector CT imaging of the head and cervical spine was performed following the standard protocol without intravenous contrast. Multiplanar CT image reconstructions of the cervical spine were also generated. COMPARISON:  06/19/2017 FINDINGS: CT HEAD FINDINGS Brain:  Stable cerebral atrophy. Low-density in the periventricular white matter. No evidence for acute hemorrhage, mass lesion, midline shift, hydrocephalus or large infarct. Vascular: No hyperdense vessel or unexpected calcification. Skull: Normal. Negative for fracture or focal lesion. Sinuses/Orbits: Minimal disease in the posterior right ethmoid air cells. Other: Small amount of soft tissue swelling along the right lateral orbit. No underlying fracture. CT CERVICAL SPINE FINDINGS Alignment: Normal. Skull base and vertebrae: No acute fracture. No primary bone lesion or focal pathologic process. Degenerative changes at C1-C2 are stable. Multilevel degenerative facet disease, particularly at C2-C3 with evidence of some ankylosis. Soft tissues and spinal canal: No prevertebral fluid or swelling. No visible canal hematoma. Disc levels: Mild disc space narrowing and endplate disease at J6-B3. Upper chest: Negative. Other: None IMPRESSION: No acute intracranial abnormality. Evidence for atrophy and chronic small vessel ischemic disease. No acute bone abnormality to the cervical spine. Stable degenerative disease in the cervical  spine. Electronically Signed   By: Markus Daft M.D.   On: 08/28/2017 22:10   Ct Cervical Spine Wo Contrast  Result Date: 08/28/2017 CLINICAL DATA:  83 year old with unwitnessed fall. EXAM: CT HEAD WITHOUT CONTRAST CT CERVICAL SPINE WITHOUT CONTRAST TECHNIQUE: Multidetector CT imaging of the head and cervical spine was performed following the standard protocol without intravenous contrast. Multiplanar CT image reconstructions of the cervical spine were also generated. COMPARISON:  06/19/2017 FINDINGS: CT HEAD FINDINGS Brain: Stable cerebral atrophy. Low-density in the periventricular white matter. No evidence for acute hemorrhage, mass lesion, midline shift, hydrocephalus or large infarct. Vascular: No hyperdense vessel or unexpected calcification. Skull: Normal. Negative for fracture or focal lesion. Sinuses/Orbits: Minimal disease in the posterior right ethmoid air cells. Other: Small amount of soft tissue swelling along the right lateral orbit. No underlying fracture. CT CERVICAL SPINE FINDINGS Alignment: Normal. Skull base and vertebrae: No acute fracture. No primary bone lesion or focal pathologic process. Degenerative changes at C1-C2 are stable. Multilevel degenerative facet disease, particularly at C2-C3 with evidence of some ankylosis. Soft tissues and spinal canal: No prevertebral fluid or swelling. No visible canal hematoma. Disc levels: Mild disc space narrowing and endplate disease at A1-P3. Upper chest: Negative. Other: None IMPRESSION: No acute intracranial abnormality. Evidence for atrophy and chronic small vessel ischemic disease. No acute bone abnormality to the cervical spine. Stable degenerative disease in the cervical spine. Electronically Signed   By: Markus Daft M.D.   On: 08/28/2017 22:10    Procedures .Marland KitchenLaceration Repair Date/Time: 08/28/2017 11:00 PM Performed by: Malvin Johns, MD Authorized by: Malvin Johns, MD   Consent:    Consent obtained: pt unable to give  consent. Laceration details:    Location:  Face   Face location:  Forehead   Length (cm):  2 Repair type:    Repair type:  Simple Exploration:    Hemostasis achieved with:  Direct pressure   Wound extent: no fascia violation noted, no foreign bodies/material noted, no nerve damage noted and no vascular damage noted     Contaminated: no   Treatment:    Area cleansed with:  Saline   Amount of cleaning:  Standard   Irrigation solution:  Sterile saline   Irrigation method:  Syringe   Visualized foreign bodies/material removed: no   Skin repair:    Repair method:  Tissue adhesive Approximation:    Approximation:  Close Post-procedure details:    Dressing:  Open (no dressing)   Patient tolerance of procedure:  Tolerated well, no immediate complications   (including critical care time)  Medications Ordered in ED Medications  Tdap (BOOSTRIX) injection 0.5 mL (0.5 mLs Intramuscular Given 08/28/17 2125)  LORazepam (ATIVAN) injection 2 mg (2 mg Intramuscular Given 08/28/17 2124)     Initial Impression / Assessment and Plan / ED Course  I have reviewed the triage vital signs and the nursing notes.  Pertinent labs & imaging results that were available during my care of the patient were reviewed by me and considered in my medical decision making (see chart for details).     Patient is a 83 year old female who had an unwitnessed fall.  She seems to be at baseline mental status per prior records.  She has evidence of a minor laceration to her forehead.  This was repaired with Dermabond.  CT scans of her head and cervical spine show no acute abnormalities.  She does not have any other evidence of injuries on examination.  She was discharged home in good condition.  She was discharged with wound care instructions and return precautions.  Final Clinical Impressions(s) / ED Diagnoses   Final diagnoses:  Fall, initial encounter  Forehead laceration, initial encounter    ED Discharge Orders     None       Malvin Johns, MD 08/28/17 [phone]

## 2017-08-28 NOTE — ED Notes (Signed)
Bed: [facility] Expected date:  Expected time:  Means of arrival:  Comments: EMS 83 yo fall from SNF/dementia-fell forward on to face/facial lac-hyperglycemia CBG [id number]

## 2017-08-28 NOTE — ED Notes (Signed)
Attempt x 3 to call report-only getting a voicemail

## 2017-08-28 NOTE — ED Notes (Signed)
[facility] called for transportation back to facility  No contact information could be found on paperwork to give report

## 2017-08-28 NOTE — ED Notes (Signed)
Pt refused oral temp- tried to bite probe and then pushed probe and writers hand away.

## 2017-08-29 NOTE — ED Notes (Signed)
[facility] here to transport patient home.  

## 2017-08-29 NOTE — ED Notes (Signed)
Pt continues to wait on [facility] for transportation

## 2017-09-25 ENCOUNTER — Ambulatory Visit: Payer: Medicare Other | Admitting: Neurology

## 2018-02-06 DEATH — deceased
